# Patient Record
Sex: Female | Born: 1945
Health system: Southern US, Community
[De-identification: ages and names within clinical notes are randomized; demographics above are authoritative.]

## PROBLEM LIST (undated history)

## (undated) DIAGNOSIS — D649 Anemia, unspecified: Secondary | ICD-10-CM

## (undated) DIAGNOSIS — F0390 Unspecified dementia without behavioral disturbance: Secondary | ICD-10-CM

## (undated) DIAGNOSIS — K219 Gastro-esophageal reflux disease without esophagitis: Secondary | ICD-10-CM

## (undated) HISTORY — DX: Anemia, unspecified: D64.9

## (undated) HISTORY — PX: TOOTH EXTRACTION: SUR596

## (undated) HISTORY — DX: Gastro-esophageal reflux disease without esophagitis: K21.9

---

## 1998-08-27 ENCOUNTER — Other Ambulatory Visit: Admission: RE | Admit: 1998-08-27 | Discharge: 1998-08-27 | Payer: Self-pay | Admitting: Obstetrics and Gynecology

## 2001-08-14 ENCOUNTER — Other Ambulatory Visit: Admission: RE | Admit: 2001-08-14 | Discharge: 2001-08-14 | Payer: Self-pay | Admitting: Obstetrics and Gynecology

## 2001-08-20 ENCOUNTER — Encounter: Payer: Self-pay | Admitting: Obstetrics and Gynecology

## 2001-08-20 ENCOUNTER — Encounter: Admission: RE | Admit: 2001-08-20 | Discharge: 2001-08-20 | Payer: Self-pay | Admitting: Obstetrics and Gynecology

## 2003-01-22 ENCOUNTER — Encounter: Payer: Self-pay | Admitting: Obstetrics and Gynecology

## 2003-01-22 ENCOUNTER — Encounter: Admission: RE | Admit: 2003-01-22 | Discharge: 2003-01-22 | Payer: Self-pay | Admitting: Obstetrics and Gynecology

## 2004-10-14 ENCOUNTER — Encounter: Admission: RE | Admit: 2004-10-14 | Discharge: 2004-10-14 | Payer: Self-pay | Admitting: Obstetrics and Gynecology

## 2006-01-13 ENCOUNTER — Encounter: Admission: RE | Admit: 2006-01-13 | Discharge: 2006-01-13 | Payer: Self-pay | Admitting: Obstetrics and Gynecology

## 2008-04-29 ENCOUNTER — Ambulatory Visit: Payer: Self-pay | Admitting: Internal Medicine

## 2008-05-02 ENCOUNTER — Telehealth: Payer: Self-pay | Admitting: Internal Medicine

## 2008-05-13 ENCOUNTER — Encounter: Payer: Self-pay | Admitting: Internal Medicine

## 2008-05-13 ENCOUNTER — Ambulatory Visit: Payer: Self-pay | Admitting: Internal Medicine

## 2008-05-14 ENCOUNTER — Encounter: Payer: Self-pay | Admitting: Internal Medicine

## 2008-05-27 ENCOUNTER — Encounter: Admission: RE | Admit: 2008-05-27 | Discharge: 2008-05-27 | Payer: Self-pay | Admitting: Family Medicine

## 2008-06-06 ENCOUNTER — Encounter: Admission: RE | Admit: 2008-06-06 | Discharge: 2008-06-06 | Payer: Self-pay | Admitting: Family Medicine

## 2009-03-28 ENCOUNTER — Emergency Department (HOSPITAL_COMMUNITY): Admission: EM | Admit: 2009-03-28 | Discharge: 2009-03-28 | Payer: Self-pay | Admitting: Emergency Medicine

## 2009-04-10 ENCOUNTER — Ambulatory Visit: Payer: Self-pay | Admitting: Internal Medicine

## 2009-04-10 DIAGNOSIS — R0789 Other chest pain: Secondary | ICD-10-CM

## 2009-05-13 ENCOUNTER — Ambulatory Visit: Payer: Self-pay | Admitting: Cardiology

## 2009-05-13 ENCOUNTER — Encounter: Payer: Self-pay | Admitting: Cardiology

## 2009-05-13 DIAGNOSIS — K219 Gastro-esophageal reflux disease without esophagitis: Secondary | ICD-10-CM | POA: Insufficient documentation

## 2009-05-19 ENCOUNTER — Telehealth: Payer: Self-pay | Admitting: Internal Medicine

## 2009-05-26 ENCOUNTER — Ambulatory Visit: Payer: Self-pay

## 2009-05-26 ENCOUNTER — Encounter: Payer: Self-pay | Admitting: Cardiology

## 2010-04-11 IMAGING — CR DG CHEST 2V
2 series · 2 of 2 positions shown · non-contrast
Comparison: None.

CLINICAL DATA: Chest pain.

CHEST - 2 VIEW

[w chest pa]
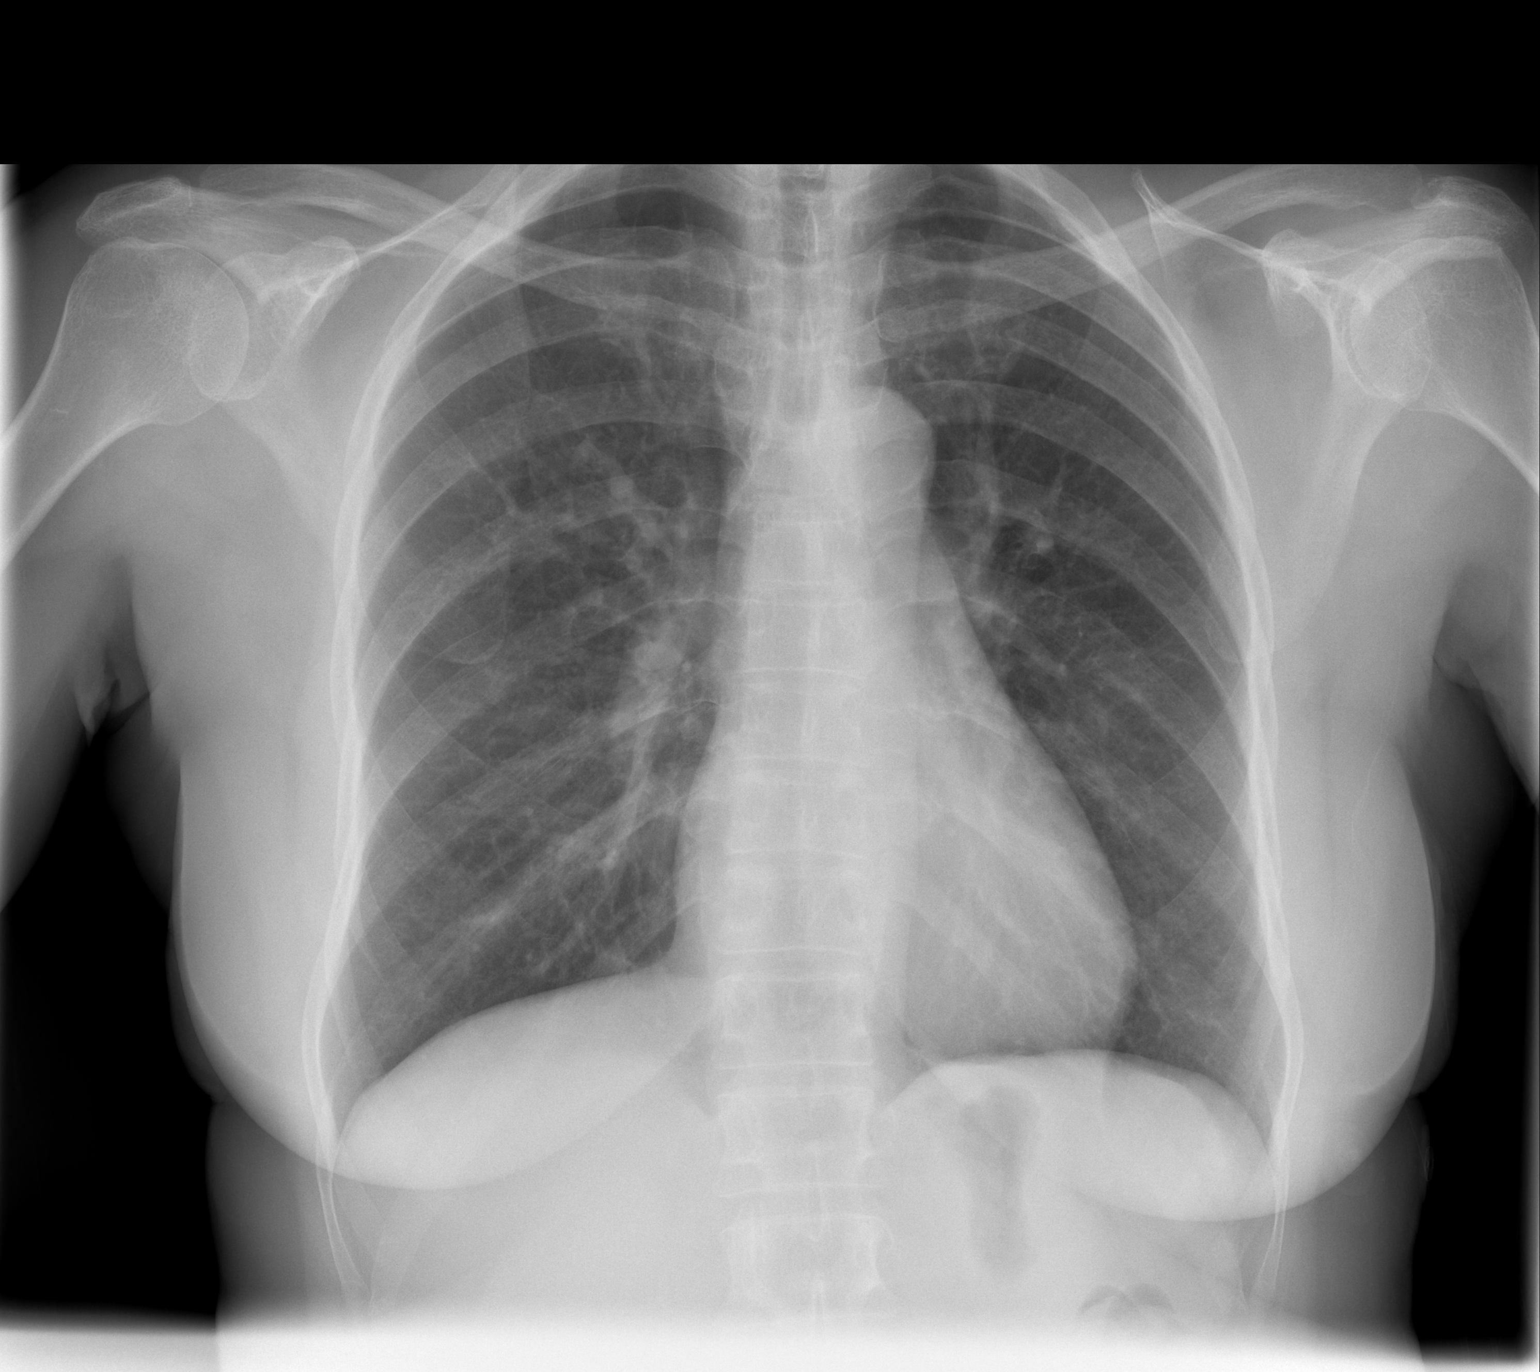

[w chest lat]
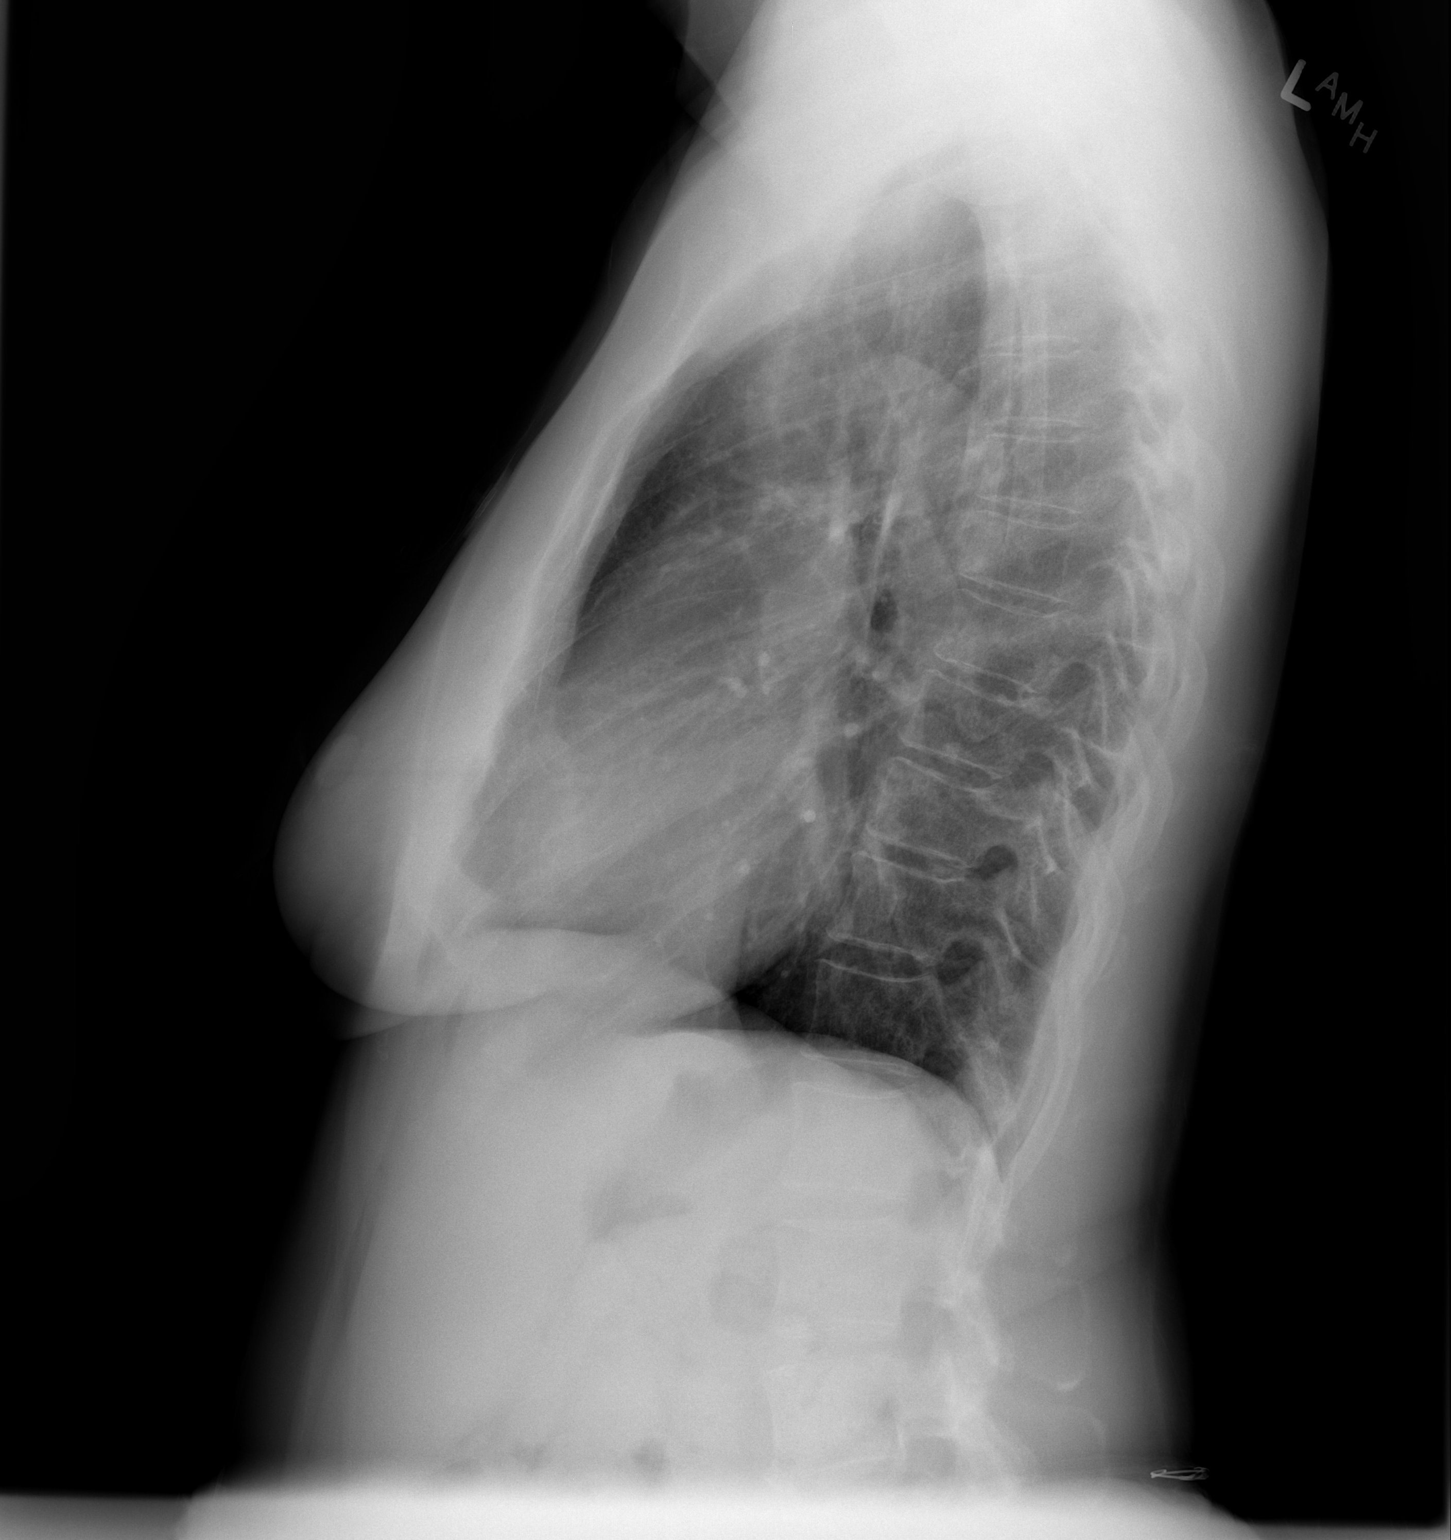

[2 of 2 positions shown; findings below may reference images not displayed]

FINDINGS: The lungs are clear without focal infiltrate, edema,
pneumothorax or pleural effusion. Interstitial markings are
diffusely coarsened with chronic features. The cardiopericardial
silhouette is within normal limits for size. Imaged bony structures
of the thorax are intact.
IMPRESSION: No acute cardiopulmonary process.

## 2010-11-16 ENCOUNTER — Encounter
Admission: RE | Admit: 2010-11-16 | Discharge: 2010-11-16 | Payer: Self-pay | Source: Home / Self Care | Attending: Internal Medicine | Admitting: Internal Medicine

## 2010-12-20 ENCOUNTER — Encounter: Payer: Self-pay | Admitting: Family Medicine

## 2011-03-08 LAB — POCT CARDIAC MARKERS
CKMB, poc: 1.6 ng/mL (ref 1.0–8.0)
Myoglobin, poc: 133 ng/mL (ref 12–200)

## 2011-03-08 LAB — POCT I-STAT, CHEM 8
HCT: 40 % (ref 36.0–46.0)
Hemoglobin: 13.6 g/dL (ref 12.0–15.0)
Potassium: 4.4 mEq/L (ref 3.5–5.1)
Sodium: 140 mEq/L (ref 135–145)
TCO2: 28 mmol/L (ref 0–100)

## 2011-03-08 LAB — D-DIMER, QUANTITATIVE: D-Dimer, Quant: 1.11 ug/mL-FEU — ABNORMAL HIGH (ref 0.00–0.48)

## 2011-07-11 ENCOUNTER — Encounter: Payer: Self-pay | Admitting: Cardiology

## 2012-09-17 ENCOUNTER — Other Ambulatory Visit: Payer: Self-pay | Admitting: Internal Medicine

## 2012-09-17 DIAGNOSIS — Z1231 Encounter for screening mammogram for malignant neoplasm of breast: Secondary | ICD-10-CM

## 2012-09-20 ENCOUNTER — Ambulatory Visit
Admission: RE | Admit: 2012-09-20 | Discharge: 2012-09-20 | Disposition: A | Discharge status: Home / Self Care | Payer: BC Managed Care – PPO | Source: Ambulatory Visit | Attending: Internal Medicine | Admitting: Internal Medicine

## 2012-09-20 DIAGNOSIS — Z1231 Encounter for screening mammogram for malignant neoplasm of breast: Secondary | ICD-10-CM

## 2013-09-04 DIAGNOSIS — E559 Vitamin D deficiency, unspecified: Secondary | ICD-10-CM | POA: Diagnosis not present

## 2013-09-04 DIAGNOSIS — L258 Unspecified contact dermatitis due to other agents: Secondary | ICD-10-CM | POA: Diagnosis not present

## 2013-09-04 DIAGNOSIS — Z124 Encounter for screening for malignant neoplasm of cervix: Secondary | ICD-10-CM | POA: Diagnosis not present

## 2013-09-04 DIAGNOSIS — Z23 Encounter for immunization: Secondary | ICD-10-CM | POA: Diagnosis not present

## 2013-09-04 DIAGNOSIS — Z01419 Encounter for gynecological examination (general) (routine) without abnormal findings: Secondary | ICD-10-CM | POA: Diagnosis not present

## 2013-09-04 DIAGNOSIS — Z0000 Encounter for general adult medical examination without abnormal findings: Secondary | ICD-10-CM | POA: Diagnosis not present

## 2014-10-20 DIAGNOSIS — Z136 Encounter for screening for cardiovascular disorders: Secondary | ICD-10-CM | POA: Diagnosis not present

## 2014-10-20 DIAGNOSIS — Z1329 Encounter for screening for other suspected endocrine disorder: Secondary | ICD-10-CM | POA: Diagnosis not present

## 2014-10-20 DIAGNOSIS — Z0000 Encounter for general adult medical examination without abnormal findings: Secondary | ICD-10-CM | POA: Diagnosis not present

## 2014-10-20 DIAGNOSIS — Z131 Encounter for screening for diabetes mellitus: Secondary | ICD-10-CM | POA: Diagnosis not present

## 2014-10-20 DIAGNOSIS — Z1239 Encounter for other screening for malignant neoplasm of breast: Secondary | ICD-10-CM | POA: Diagnosis not present

## 2014-10-21 ENCOUNTER — Other Ambulatory Visit: Payer: Self-pay | Admitting: Family Medicine

## 2014-10-21 DIAGNOSIS — E2839 Other primary ovarian failure: Secondary | ICD-10-CM

## 2014-10-21 DIAGNOSIS — Z1231 Encounter for screening mammogram for malignant neoplasm of breast: Secondary | ICD-10-CM

## 2014-12-04 ENCOUNTER — Ambulatory Visit
Admission: RE | Admit: 2014-12-04 | Discharge: 2014-12-04 | Disposition: A | Discharge status: Home / Self Care | Payer: Commercial Managed Care - HMO | Source: Ambulatory Visit | Attending: Family Medicine | Admitting: Family Medicine

## 2014-12-04 DIAGNOSIS — E2839 Other primary ovarian failure: Secondary | ICD-10-CM

## 2014-12-04 DIAGNOSIS — Z1231 Encounter for screening mammogram for malignant neoplasm of breast: Secondary | ICD-10-CM

## 2015-04-01 ENCOUNTER — Encounter: Payer: Self-pay | Admitting: Internal Medicine

## 2018-04-12 ENCOUNTER — Encounter: Payer: Self-pay | Admitting: Gastroenterology

## 2018-12-25 ENCOUNTER — Other Ambulatory Visit: Payer: Self-pay | Admitting: Family Medicine

## 2018-12-25 DIAGNOSIS — M8580 Other specified disorders of bone density and structure, unspecified site: Secondary | ICD-10-CM

## 2018-12-25 DIAGNOSIS — Z1231 Encounter for screening mammogram for malignant neoplasm of breast: Secondary | ICD-10-CM

## 2019-02-21 ENCOUNTER — Other Ambulatory Visit: Payer: Commercial Managed Care - HMO

## 2019-02-21 ENCOUNTER — Ambulatory Visit: Payer: Commercial Managed Care - HMO

## 2019-05-02 ENCOUNTER — Other Ambulatory Visit: Payer: Self-pay

## 2019-05-02 ENCOUNTER — Other Ambulatory Visit: Payer: Self-pay | Admitting: Family Medicine

## 2019-05-02 ENCOUNTER — Ambulatory Visit
Admission: RE | Admit: 2019-05-02 | Discharge: 2019-05-02 | Disposition: A | Discharge status: Home / Self Care | Payer: Commercial Managed Care - HMO | Source: Ambulatory Visit | Attending: Family Medicine | Admitting: Family Medicine

## 2019-05-02 ENCOUNTER — Ambulatory Visit
Admission: RE | Admit: 2019-05-02 | Discharge: 2019-05-02 | Disposition: A | Discharge status: Home / Self Care | Payer: Medicare Other | Source: Ambulatory Visit | Attending: Family Medicine | Admitting: Family Medicine

## 2019-05-02 DIAGNOSIS — M8580 Other specified disorders of bone density and structure, unspecified site: Secondary | ICD-10-CM

## 2019-05-02 DIAGNOSIS — Z1231 Encounter for screening mammogram for malignant neoplasm of breast: Secondary | ICD-10-CM

## 2020-01-11 ENCOUNTER — Ambulatory Visit: Payer: Self-pay | Attending: Internal Medicine

## 2020-01-11 DIAGNOSIS — Z23 Encounter for immunization: Secondary | ICD-10-CM | POA: Insufficient documentation

## 2020-01-11 NOTE — Progress Notes (Signed)
   Covid-19 Vaccination Clinic  Name:  Sharon Elliott    MRN: [id number] DOB: 1946/04/03  01/11/2020  [name] was observed post Covid-19 immunization for 15 minutes without incidence. She was provided with Vaccine Information Sheet and instruction to access the V-Safe system.   Ms. Cheese was instructed to call 911 with any severe reactions post vaccine: Marland Kitchen Difficulty breathing  . Swelling of your face and throat  . A fast heartbeat  . A bad rash all over your body  . Dizziness and weakness    Immunizations Administered    Name Date Dose VIS Date Route   Pfizer COVID-19 Vaccine 01/11/2020  2:28 PM 0.3 mL 11/08/2019 Intramuscular   Manufacturer: [employer], [employer]   Lot: [id number]   NDC: [address]-8

## 2020-02-03 ENCOUNTER — Ambulatory Visit: Payer: Medicare PPO | Attending: Internal Medicine

## 2020-02-03 DIAGNOSIS — Z23 Encounter for immunization: Secondary | ICD-10-CM | POA: Insufficient documentation

## 2020-02-03 NOTE — Progress Notes (Signed)
   Covid-19 Vaccination Clinic  Name:  Sharon Elliott    MRN: [id number] DOB: [birth year]  02/03/2020  Sharon Elliott was observed post Covid-19 immunization for 15 minutes without incident. She was provided with Vaccine Information Sheet and instruction to access the V-Safe system.   Sharon Elliott was instructed to call 911 with any severe reactions post vaccine: Marland Kitchen Difficulty breathing  . Swelling of face and throat  . A fast heartbeat  . A bad rash all over body  . Dizziness and weakness   Immunizations Administered    Name Date Dose VIS Date Route   Pfizer COVID-19 Vaccine 02/03/2020 11:48 AM 0.3 mL 11/08/2019 Intramuscular   Manufacturer: [employer], [employer]   Lot: [id number]   NDC: [address]-8

## 2020-03-30 ENCOUNTER — Other Ambulatory Visit: Payer: Self-pay | Admitting: Family Medicine

## 2020-03-30 DIAGNOSIS — Z1231 Encounter for screening mammogram for malignant neoplasm of breast: Secondary | ICD-10-CM

## 2020-05-04 ENCOUNTER — Other Ambulatory Visit: Payer: Self-pay

## 2020-05-04 ENCOUNTER — Ambulatory Visit
Admission: RE | Admit: 2020-05-04 | Discharge: 2020-05-04 | Disposition: A | Discharge status: Home / Self Care | Payer: Medicare PPO | Source: Ambulatory Visit | Attending: Family Medicine | Admitting: Family Medicine

## 2020-05-04 DIAGNOSIS — Z1231 Encounter for screening mammogram for malignant neoplasm of breast: Secondary | ICD-10-CM

## 2020-05-15 IMAGING — MG DIGITAL SCREENING BILATERAL MAMMOGRAM WITH TOMO AND CAD
8 series · 9 of 24 positions shown · non-contrast
Comparison: Previous exam(s).

CLINICAL DATA: Screening.

EXAM:
DIGITAL SCREENING BILATERAL MAMMOGRAM WITH TOMO AND CAD

[L CC synth-2D]
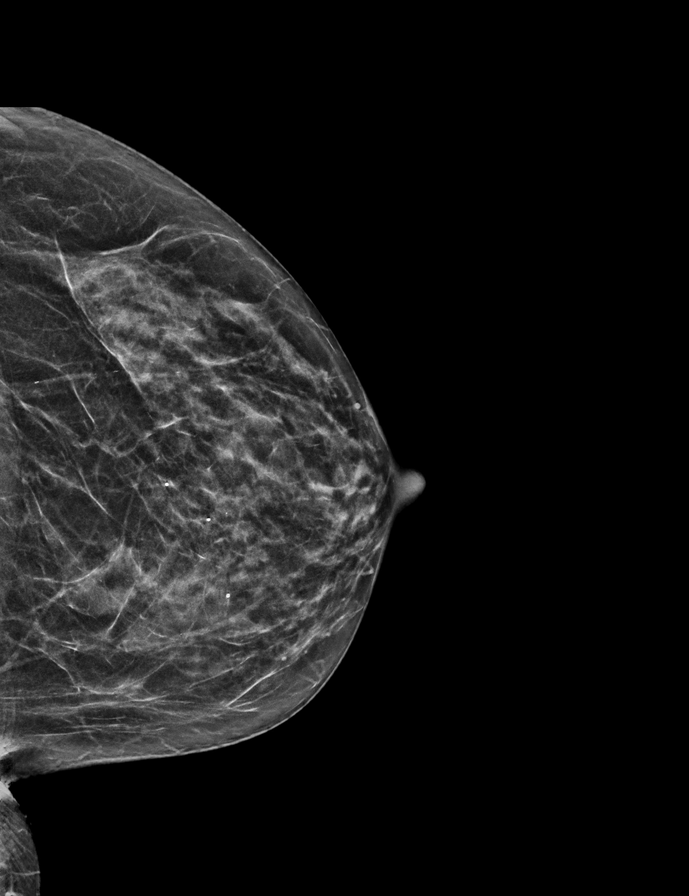

[L MLO synth-2D]
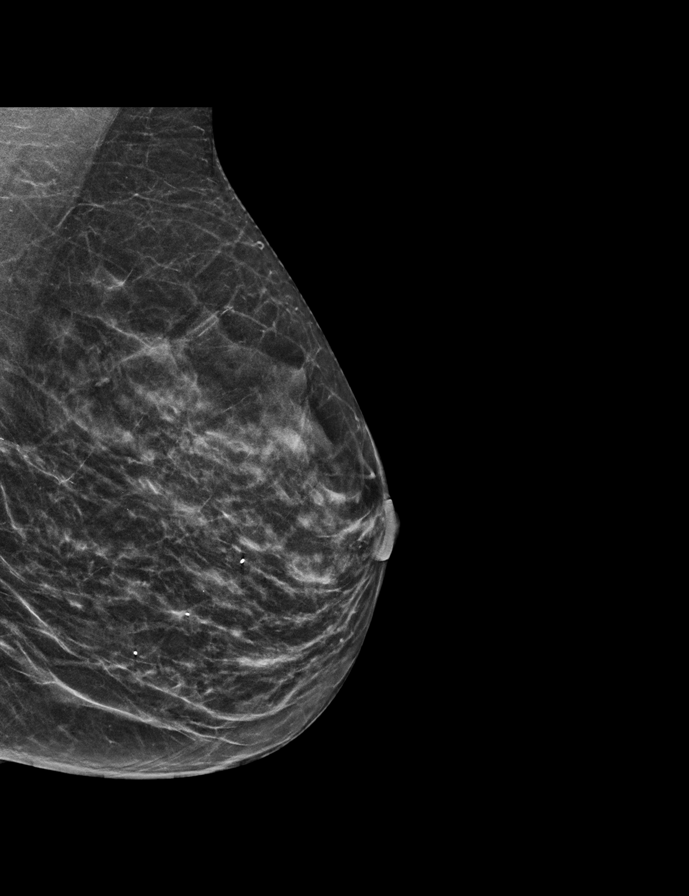

[R CC synth-2D]
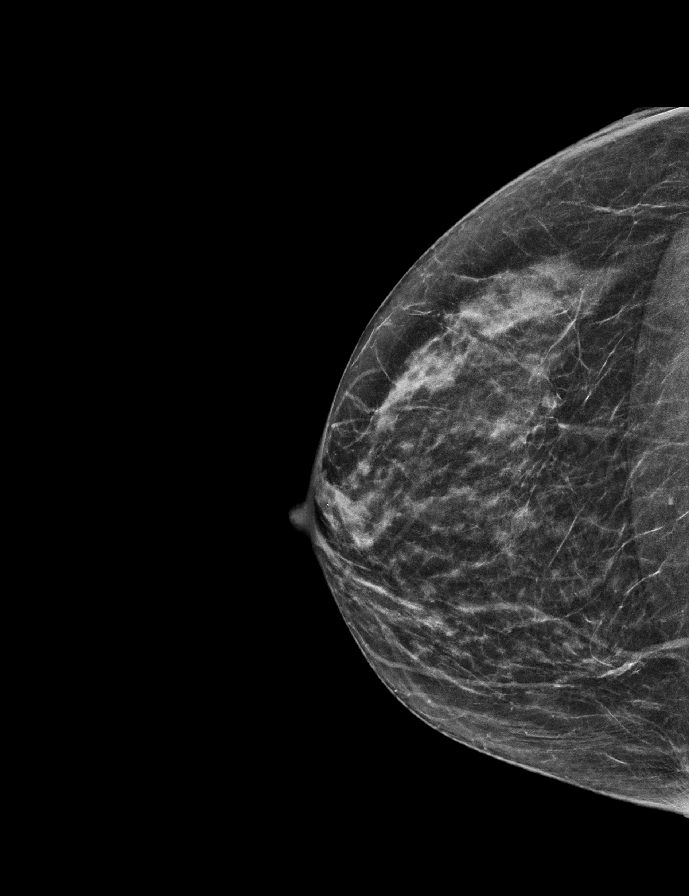

[R MLO synth-2D]
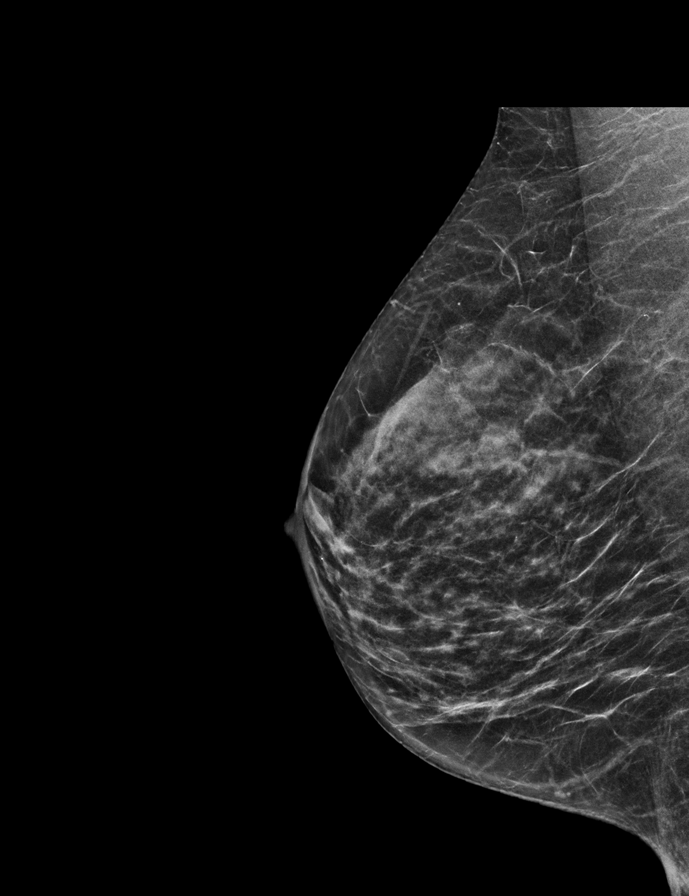

[R CC tomo · 2 of 49 frames shown]
[frame 16/49]
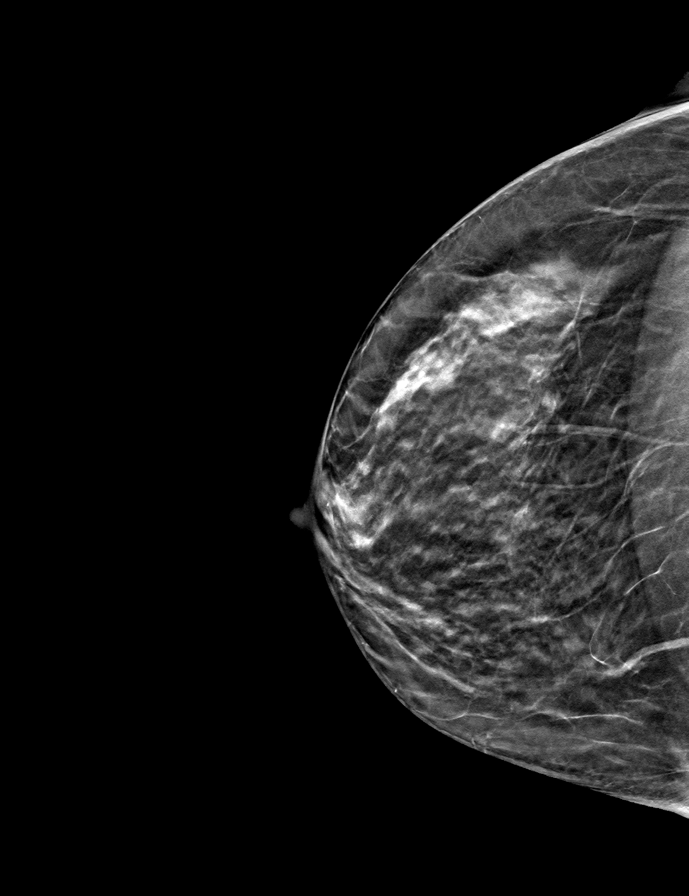
[frame 25/49]
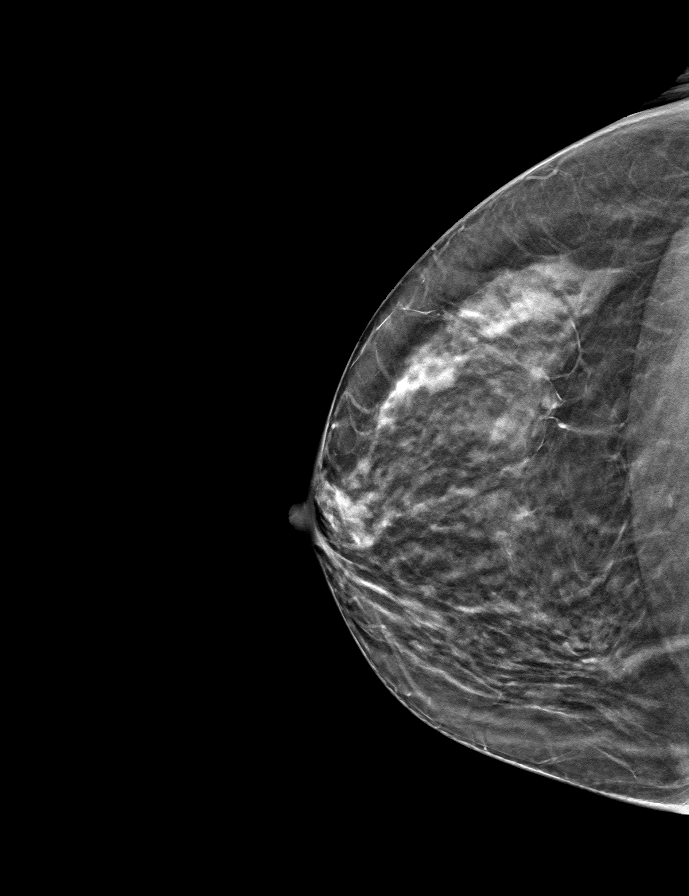

[R MLO tomo · tomo slice 24/47.0]
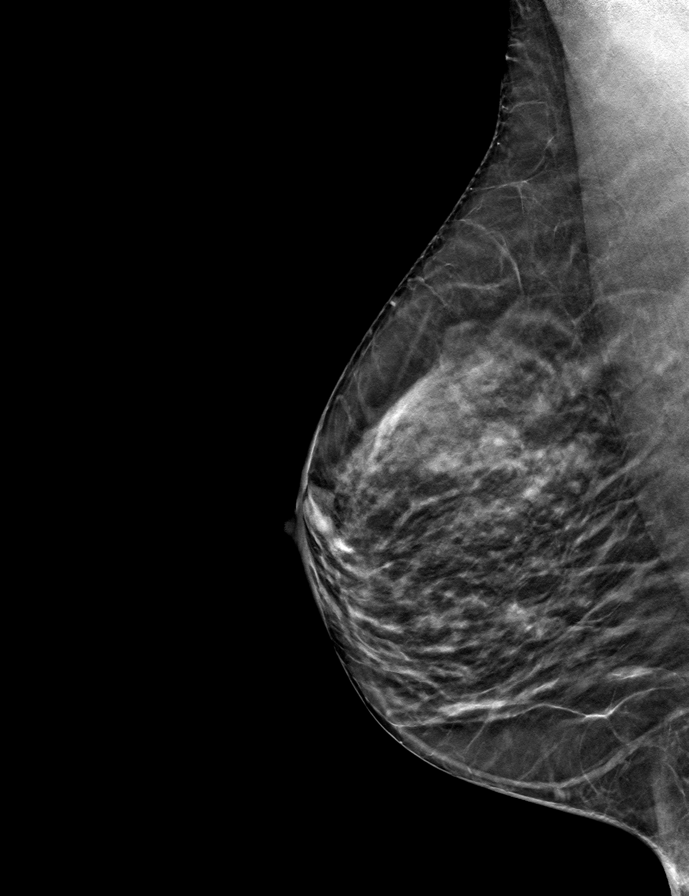

[L MLO tomo · tomo slice 24/47.0]
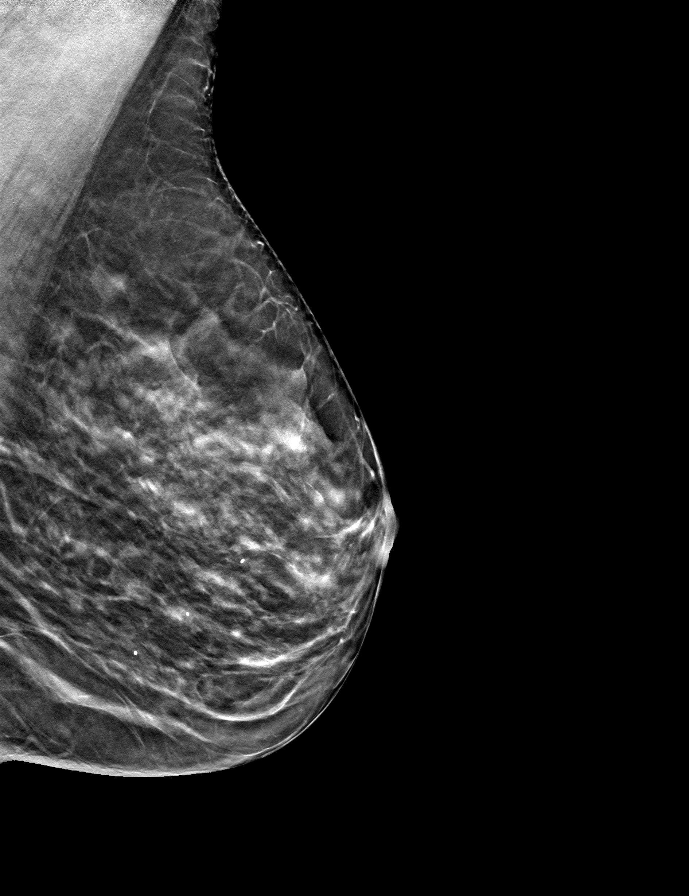

[L CC tomo · tomo slice 25/48.0]
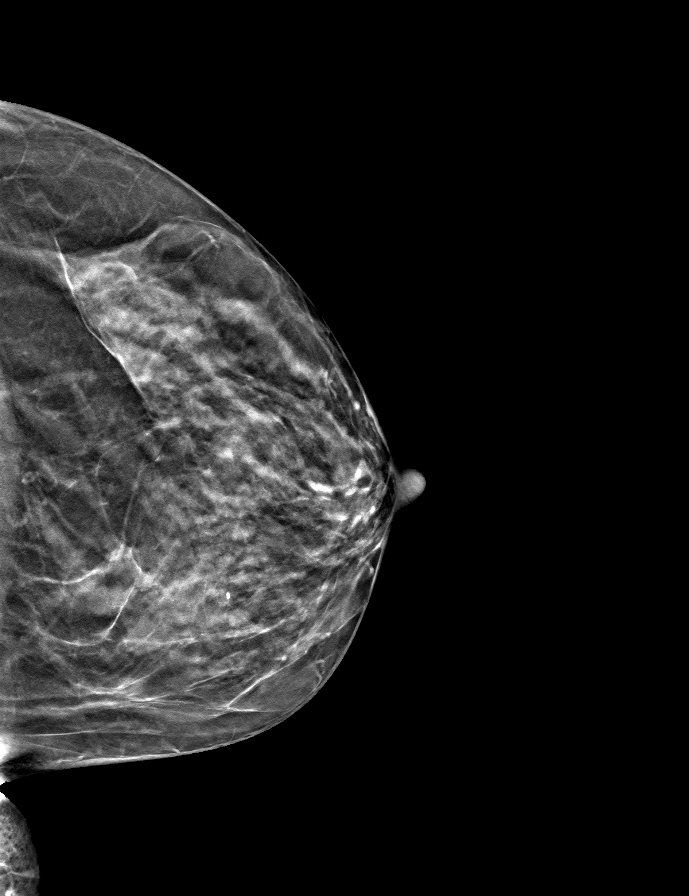

[9 of 24 positions shown; findings below may reference images not displayed]

ACR Breast Density Category c: The breast tissue is heterogeneously
dense, which may obscure small masses.
FINDINGS: There are no findings suspicious for malignancy. Images were
processed with CAD.
IMPRESSION: No mammographic evidence of malignancy. A result letter of this
screening mammogram will be mailed directly to the patient.

RECOMMENDATION:
Screening mammogram in one year. (Code:[id number])

BI-RADS CATEGORY  1: Negative.

## 2020-06-26 DIAGNOSIS — Z820 Family history of epilepsy and other diseases of the nervous system: Secondary | ICD-10-CM | POA: Diagnosis not present

## 2020-06-26 DIAGNOSIS — Z85828 Personal history of other malignant neoplasm of skin: Secondary | ICD-10-CM | POA: Diagnosis not present

## 2020-06-26 DIAGNOSIS — Z809 Family history of malignant neoplasm, unspecified: Secondary | ICD-10-CM | POA: Diagnosis not present

## 2020-06-26 DIAGNOSIS — R030 Elevated blood-pressure reading, without diagnosis of hypertension: Secondary | ICD-10-CM | POA: Diagnosis not present

## 2020-08-21 ENCOUNTER — Other Ambulatory Visit: Payer: Medicare PPO

## 2020-08-21 DIAGNOSIS — Z20822 Contact with and (suspected) exposure to covid-19: Secondary | ICD-10-CM | POA: Diagnosis not present

## 2020-08-22 LAB — NOVEL CORONAVIRUS, NAA: SARS-CoV-2, NAA: NOT DETECTED

## 2020-08-22 LAB — SARS-COV-2, NAA 2 DAY TAT

## 2020-08-26 DIAGNOSIS — Z23 Encounter for immunization: Secondary | ICD-10-CM | POA: Diagnosis not present

## 2020-08-26 DIAGNOSIS — R413 Other amnesia: Secondary | ICD-10-CM | POA: Diagnosis not present

## 2020-09-26 ENCOUNTER — Ambulatory Visit: Payer: Medicare PPO | Attending: Internal Medicine

## 2020-09-26 DIAGNOSIS — Z23 Encounter for immunization: Secondary | ICD-10-CM

## 2020-09-26 NOTE — Progress Notes (Signed)
   Covid-19 Vaccination Clinic  Name:  CHAELA BRANSCUM    MRN: [id number] DOB: [birth year]  09/26/2020  Ms. Howeth was observed post Covid-19 immunization for 15 minutes without incident. She was provided with Vaccine Information Sheet and instruction to access the V-Safe system.   Ms. Montville was instructed to call 911 with any severe reactions post vaccine: Marland Kitchen Difficulty breathing  . Swelling of face and throat  . A fast heartbeat  . A bad rash all over body  . Dizziness and weakness

## 2021-04-21 DIAGNOSIS — Z131 Encounter for screening for diabetes mellitus: Secondary | ICD-10-CM | POA: Diagnosis not present

## 2021-04-21 DIAGNOSIS — Z0000 Encounter for general adult medical examination without abnormal findings: Secondary | ICD-10-CM | POA: Diagnosis not present

## 2021-04-21 DIAGNOSIS — R413 Other amnesia: Secondary | ICD-10-CM | POA: Diagnosis not present

## 2021-04-21 DIAGNOSIS — M8588 Other specified disorders of bone density and structure, other site: Secondary | ICD-10-CM | POA: Diagnosis not present

## 2021-04-21 DIAGNOSIS — E785 Hyperlipidemia, unspecified: Secondary | ICD-10-CM | POA: Diagnosis not present

## 2021-04-21 DIAGNOSIS — M8580 Other specified disorders of bone density and structure, unspecified site: Secondary | ICD-10-CM | POA: Diagnosis not present

## 2021-04-21 DIAGNOSIS — Z23 Encounter for immunization: Secondary | ICD-10-CM | POA: Diagnosis not present

## 2021-04-21 DIAGNOSIS — Z1211 Encounter for screening for malignant neoplasm of colon: Secondary | ICD-10-CM | POA: Diagnosis not present

## 2021-04-21 DIAGNOSIS — Z1389 Encounter for screening for other disorder: Secondary | ICD-10-CM | POA: Diagnosis not present

## 2021-05-14 ENCOUNTER — Other Ambulatory Visit: Payer: Self-pay

## 2021-05-14 ENCOUNTER — Other Ambulatory Visit (HOSPITAL_BASED_OUTPATIENT_CLINIC_OR_DEPARTMENT_OTHER): Payer: Self-pay

## 2021-05-14 ENCOUNTER — Ambulatory Visit: Payer: Medicare PPO | Attending: Internal Medicine

## 2021-05-14 DIAGNOSIS — Z23 Encounter for immunization: Secondary | ICD-10-CM

## 2021-05-14 MED ORDER — PFIZER-BIONT COVID-19 VAC-TRIS 30 MCG/0.3ML IM SUSP
INTRAMUSCULAR | 0 refills | Status: DC
  Filled 2021-05-14: qty 0.3, 1d supply, fill #0

## 2021-05-14 NOTE — Progress Notes (Signed)
   Covid-19 Vaccination Clinic  Name:  Sharon Elliott    MRN: [id number] DOB: 1946/07/20  05/14/2021  Sharon Elliott was observed post Covid-19 immunization for 15 minutes without incident. She was provided with Vaccine Information Sheet and instruction to access the V-Safe system.   Sharon Elliott was instructed to call 911 with any severe reactions post vaccine: Difficulty breathing  Swelling of face and throat  A fast heartbeat  A bad rash all over body  Dizziness and weakness   Immunizations Administered     Name Date Dose VIS Date Route   PFIZER Comrnaty(Gray TOP) Covid-19 Vaccine 05/14/2021 10:40 AM 0.3 mL 11/05/2020 Intramuscular   Manufacturer: [employer], [employer]   Lot: [id number]   NDC: [phone]

## 2021-10-04 ENCOUNTER — Ambulatory Visit: Payer: Medicare PPO | Attending: Internal Medicine

## 2021-10-04 ENCOUNTER — Other Ambulatory Visit: Payer: Self-pay

## 2021-10-04 ENCOUNTER — Other Ambulatory Visit (HOSPITAL_BASED_OUTPATIENT_CLINIC_OR_DEPARTMENT_OTHER): Payer: Self-pay

## 2021-10-04 DIAGNOSIS — Z23 Encounter for immunization: Secondary | ICD-10-CM

## 2021-10-04 MED ORDER — PFIZER COVID-19 VAC BIVALENT 30 MCG/0.3ML IM SUSP
INTRAMUSCULAR | 0 refills | Status: DC
  Filled 2021-10-04: qty 0.3, 1d supply, fill #0

## 2021-10-04 NOTE — Progress Notes (Signed)
   Covid-19 Vaccination Clinic  Name:  Sharon Elliott    MRN: [id number] DOB: 08/01/1946  10/04/2021  Sharon Elliott was observed post Covid-19 immunization for 15 minutes without incident. She was provided with Vaccine Information Sheet and instruction to access the V-Safe system.   Sharon Elliott was instructed to call 911 with any severe reactions post vaccine: Difficulty breathing  Swelling of face and throat  A fast heartbeat  A bad rash all over body  Dizziness and weakness   Immunizations Administered     Name Date Dose VIS Date Route   Pfizer Covid-19 Vaccine Bivalent Booster 10/04/2021  3:45 PM 0.3 mL 07/28/2021 Intramuscular   Manufacturer: [employer], [employer]   Lot: [id number]   NDC: [phone]

## 2021-11-30 DIAGNOSIS — R110 Nausea: Secondary | ICD-10-CM | POA: Diagnosis not present

## 2021-11-30 DIAGNOSIS — R030 Elevated blood-pressure reading, without diagnosis of hypertension: Secondary | ICD-10-CM | POA: Diagnosis not present

## 2021-11-30 DIAGNOSIS — R059 Cough, unspecified: Secondary | ICD-10-CM | POA: Diagnosis not present

## 2021-11-30 DIAGNOSIS — Z20822 Contact with and (suspected) exposure to covid-19: Secondary | ICD-10-CM | POA: Diagnosis not present

## 2021-11-30 DIAGNOSIS — U071 COVID-19: Secondary | ICD-10-CM | POA: Diagnosis not present

## 2022-05-04 DIAGNOSIS — R413 Other amnesia: Secondary | ICD-10-CM | POA: Diagnosis not present

## 2022-05-04 DIAGNOSIS — Z0000 Encounter for general adult medical examination without abnormal findings: Secondary | ICD-10-CM | POA: Diagnosis not present

## 2022-05-04 DIAGNOSIS — M8588 Other specified disorders of bone density and structure, other site: Secondary | ICD-10-CM | POA: Diagnosis not present

## 2022-05-04 DIAGNOSIS — E785 Hyperlipidemia, unspecified: Secondary | ICD-10-CM | POA: Diagnosis not present

## 2022-05-09 ENCOUNTER — Other Ambulatory Visit: Payer: Self-pay | Admitting: Family Medicine

## 2022-05-09 DIAGNOSIS — M8580 Other specified disorders of bone density and structure, unspecified site: Secondary | ICD-10-CM

## 2022-07-01 DIAGNOSIS — E785 Hyperlipidemia, unspecified: Secondary | ICD-10-CM | POA: Diagnosis not present

## 2022-08-05 DIAGNOSIS — E785 Hyperlipidemia, unspecified: Secondary | ICD-10-CM | POA: Diagnosis not present

## 2022-09-19 ENCOUNTER — Other Ambulatory Visit (HOSPITAL_BASED_OUTPATIENT_CLINIC_OR_DEPARTMENT_OTHER): Payer: Self-pay

## 2022-09-19 MED ORDER — COMIRNATY 30 MCG/0.3ML IM SUSY
PREFILLED_SYRINGE | INTRAMUSCULAR | 0 refills | Status: DC
  Filled 2022-09-19: qty 0.3, 1d supply, fill #0

## 2022-11-11 DIAGNOSIS — M8588 Other specified disorders of bone density and structure, other site: Secondary | ICD-10-CM | POA: Diagnosis not present

## 2022-11-11 DIAGNOSIS — E785 Hyperlipidemia, unspecified: Secondary | ICD-10-CM | POA: Diagnosis not present

## 2022-11-11 DIAGNOSIS — R413 Other amnesia: Secondary | ICD-10-CM | POA: Diagnosis not present

## 2022-11-16 ENCOUNTER — Ambulatory Visit: Payer: Medicare PPO | Admitting: Neurology

## 2022-11-16 ENCOUNTER — Encounter: Payer: Self-pay | Admitting: Neurology

## 2022-11-16 VITALS — BP 135/73 | HR 88 | Ht 66.0 in | Wt 126.0 lb

## 2022-11-16 DIAGNOSIS — G3184 Mild cognitive impairment, so stated: Secondary | ICD-10-CM

## 2022-11-16 NOTE — Progress Notes (Unsigned)
[facility]  PATIENT: Sharon Elliott DOB: 1946/03/28  REQUESTING CLINICIAN: Maurice Small, MD HISTORY FROM: Patient and daughter  REASON FOR VISIT: Memory loss    HISTORICAL  CHIEF COMPLAINT:  Chief Complaint  Patient presents with   New Patient (Initial Visit)    Rm 12. Accompanied by daughter. NP/Paper Proficient/[facility]/Eli Lewie Chamber MD [phone] loss/sched with pt in office/MS.    HISTORY OF PRESENT ILLNESS:  This is a 76 year old woman with history of hyperlipidemia who is presenting with memory decline.  Patient feels like her memory is not what it used to be.  She also reports since COVID pandemic she has not been as active as before. She feels stuck. 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  During the pandemic she was stuck in the house and since then they have noted that her memory has changed.  She has difficulty sometimes recalling recent conversation, she also have word finding difficulty  Daughter reports that she has to remind her multiple times, for instance for today's appointment she did remind her again.  At home, daughter is doing almost everything with cleaning, cooking, and shopping, and mowing the lawn.  They report that their father/husband is also sick and has decreased mobility  Patient is able to care for herself, they do not report any difficulty with using items around the house.  Daughter has taken over some of her bills.   TBI:   No past history of TBI Stroke:   no past history of stroke Seizures:   no past history of seizures Sleep:   no history of sleep apnea.  Mood:  patient denies anxiety and depression Family history of Dementia: Mother in her 7s   Functional status: independent in all ADLs and IADLs Patient lives with husband and daughter. Cooking: daughter  Cleaning: daughter  Shopping: daughter  Bathing: patient, no help needed  Toileting: patient, no  help needed Driving: yes, no accident  Bills: Husband, daughter has also been helping paying patient personal bills   Ever left the stove on by accident?: Denies  Forget how to use items around the house?: No  Getting lost going to familiar places?: Denies  Forgetting loved ones names?: Denies  Word finding difficulty? Yes Sleep: Good    OTHER MEDICAL CONDITIONS: Hyperlipidemia    REVIEW OF SYSTEMS: Full 14 system review of systems performed and negative with exception of: As noted in the HPI   ALLERGIES: No Known Allergies  HOME MEDICATIONS: Outpatient Medications Prior to Visit  Medication Sig Dispense Refill   Cholecalciferol (VITAMIN D-3 PO) Take 1 tablet by mouth daily.     Cyanocobalamin (VITAMIN B-12 PO) Take 1 capsule by mouth daily.     NON FORMULARY Take 3 tablets by mouth daily. cocoa via supplement for memory     Pyridoxine HCl (VITAMIN B-6 PO) Take by mouth.     COVID-19 mRNA bivalent vaccine, Pfizer, (PFIZER COVID-19 VAC BIVALENT) injection Inject into the muscle. 0.3 mL 0   COVID-19 mRNA Vac-TriS, Pfizer, (PFIZER-BIONT COVID-19 VAC-TRIS) SUSP injection Inject into the muscle. 0.3 mL 0   COVID-19 mRNA vaccine 2023-2024 (COMIRNATY) syringe Inject into the muscle. 0.3 mL 0   NON FORMULARY Calcium 200 IU: daily      NON FORMULARY COD Liver Oil: daily      Soy Isoflavones 50 MG TABS Take by mouth daily.       vitamin E 600 UNIT capsule Take 600 Units by  mouth daily.       No facility-administered medications prior to visit.    PAST MEDICAL HISTORY: Past Medical History:  Diagnosis Date   Anemia    hx   GERD (gastroesophageal reflux disease)     PAST SURGICAL HISTORY: History reviewed. No pertinent surgical history.  FAMILY HISTORY: Family History  Problem Relation Age of Onset   Breast cancer Other        1st degree relative <50   Stroke Other        F 1st degree relative <60   Hypertension Other    Diabetes Other        1st degree relative     SOCIAL HISTORY: Social History   Socioeconomic History   Marital status: Married    Spouse name: Not on file   Number of children: Not on file   Years of education: Not on file   Highest education level: Not on file  Occupational History   Not on file  Tobacco Use   Smoking status: Never   Smokeless tobacco: Not on file  Substance and Sexual Activity   Alcohol use: No   Drug use: Not on file   Sexual activity: Not on file  Other Topics Concern   Not on file  Social History Narrative   Not on file   Social Determinants of Health   Financial Resource Strain: Not on file  Food Insecurity: Not on file  Transportation Needs: Not on file  Physical Activity: Not on file  Stress: Not on file  Social Connections: Not on file  Intimate Partner Violence: Not on file    PHYSICAL EXAM  GENERAL EXAM/CONSTITUTIONAL: Vitals:  Vitals:   11/16/22 1315  BP: 135/73  Pulse: 88  Weight: 126 lb (57.2 kg)  Height: 5\' 6"  (1.676 m)   Body mass index is 20.34 kg/m. Wt Readings from Last 3 Encounters:  11/16/22 126 lb (57.2 kg)  05/13/09 158 lb 8 oz (71.9 kg)   Patient is in no distress; well developed, nourished and groomed; neck is supple   EYES: Visual fields full to confrontation, Extraocular movements intacts,   MUSCULOSKELETAL: Gait, strength, tone, movements noted in Neurologic exam below  NEUROLOGIC: MENTAL STATUS:      No data to display            11/16/2022    1:25 PM  Montreal Cognitive Assessment   Visuospatial/ Executive (0/5) 5  Naming (0/3) 3  Attention: Read list of digits (0/2) 2  Attention: Read list of letters (0/1) 1  Attention: Serial 7 subtraction starting at 100 (0/3) 3  Language: Repeat phrase (0/2) 2  Language : Fluency (0/1) 1  Abstraction (0/2) 1  Delayed Recall (0/5) 0  Orientation (0/6) 2  Total 20  Adjusted Score (based on education) 20    CRANIAL NERVE:  2nd, 3rd, 4th, 6th- visual fields full to confrontation,  extraocular muscles intact, no nystagmus 5th - facial sensation symmetric 7th - facial strength symmetric 8th - hearing intact 9th - palate elevates symmetrically, uvula midline 11th - shoulder shrug symmetric 12th - tongue protrusion midline  MOTOR:  normal bulk and tone, full strength in the BUE, BLE  SENSORY:  normal and symmetric to light touch  COORDINATION:  finger-nose-finger, fine finger movements normal  GAIT/STATION:  normal   DIAGNOSTIC DATA (LABS, IMAGING, TESTING) - I reviewed patient records, labs, notes, testing and imaging myself where available.  Lab Results  Component Value Date   HGB 13.6 03/28/2009  HCT 40.0 03/28/2009      Component Value Date/Time   NA 140 03/28/2009 1526   K 4.4 03/28/2009 1526   CL 106 03/28/2009 1526   GLUCOSE 94 03/28/2009 1526   BUN 22 03/28/2009 1526   CREATININE 1.2 03/28/2009 1526   No results found for: "CHOL", "HDL", "LDLCALC", "LDLDIRECT", "TRIG", "CHOLHDL" No results found for: "HGBA1C" No results found for: "VITAMINB12" Lab Results  Component Value Date   TSH 1.010 11/16/2022    Vitamin B12  > 1550   04/23/2021    ASSESSMENT AND PLAN  76 y.o. year old female with history of hyperlipidemia who is presenting with memory decline described as being forgetful, needs reminders and word finding difficulty.  Today on exam she scored 20 out of 30 on the MoCA indicative of impairment.  She is still independent, able to take care of herself.  Patient likely has Mild Cognitive Impairment.  I will refer her for a formal neuropsychological testing to get a baseline and to help with the diagnosis also.  I will also obtain the ATN profile to look for biomarkers for Alzheimer's disease.  I will contact patient to go over the results.  I will see him in 1 year for follow-up or sooner if worse.  They voiced understanding    1. Mild cognitive impairment      Patient Instructions  There are well-accepted and sensible ways to  reduce risk for Alzheimers disease and other degenerative brain disorders .  Exercise Daily Walk A daily 20 minute walk should be part of your routine. Disease related apathy can be a significant roadblock to exercise and the only way to overcome this is to make it a daily routine and perhaps have a reward at the end (something your loved one loves to eat or drink perhaps) or a personal trainer coming to the home can also be very useful. Most importantly, the patient is much more likely to exercise if the caregiver / spouse does it with him/her. In general a structured, repetitive schedule is best.  General Health: Any diseases which effect your body will effect your brain such as a pneumonia, urinary infection, blood clot, heart attack or stroke. Keep contact with your primary care doctor for regular follow ups.  Sleep. A good nights sleep is healthy for the brain. Seven hours is recommended. If you have insomnia or poor sleep habits we can give you some instructions. If you have sleep apnea wear your mask.  Diet: Eating a heart healthy diet is also a good idea; fish and poultry instead of red meat, nuts (mostly non-peanuts), vegetables, fruits, olive oil or canola oil (instead of butter), minimal salt (use other spices to flavor foods), whole grain rice, bread, cereal and pasta and wine in moderation.Research is now showing that the MIND diet, which is a combination of The Mediterranean diet and the DASH diet, is beneficial for cognitive processing and longevity. Information about this diet can be found in The MIND Diet, a book by Alonna Minium, MS, RDN, and online at [employer].es  Finances, Power of [address] and Advance Directives: You should consider putting legal safeguards in place with regard to financial and medical decision making. While the spouse always has power of attorney for medical and financial issues in the absence of any form, you should consider what you  want in case the spouse / caregiver is no longer around or capable of making decisions.   Orders Placed This Encounter  Procedures   ATN  PROFILE   TSH   Ambulatory referral to Neuropsychology    No orders of the defined types were placed in this encounter.   Return in about 1 year (around 11/17/2023).  I have spent a total of 70 minutes dedicated to this patient today, preparing to see patient, performing a medically appropriate examination and evaluation, ordering tests and/or medications and procedures, and counseling and educating the patient/family/caregiver; independently interpreting result and communicating results to the family/patient/caregiver; and documenting clinical information in the electronic medical record.   Windell NorfolkAmadou Taaliyah Delpriore, MD 11/17/2022, 8:24 AM  [facility] [address], [address] [place], [place] [address] [phone]

## 2022-11-16 NOTE — Patient Instructions (Signed)
There are well-accepted and sensible ways to reduce risk for Alzheimers disease and other degenerative brain disorders .  Exercise Daily Walk A daily 20 minute walk should be part of your routine. Disease related apathy can be a significant roadblock to exercise and the only way to overcome this is to make it a daily routine and perhaps have a reward at the end (something your loved one loves to eat or drink perhaps) or a personal trainer coming to the home can also be very useful. Most importantly, the patient is much more likely to exercise if the caregiver / spouse does it with him/her. In general a structured, repetitive schedule is best.  General Health: Any diseases which effect your body will effect your brain such as a pneumonia, urinary infection, blood clot, heart attack or stroke. Keep contact with your primary care doctor for regular follow ups.  Sleep. A good nights sleep is healthy for the brain. Seven hours is recommended. If you have insomnia or poor sleep habits we can give you some instructions. If you have sleep apnea wear your mask.  Diet: Eating a heart healthy diet is also a good idea; fish and poultry instead of red meat, nuts (mostly non-peanuts), vegetables, fruits, olive oil or canola oil (instead of butter), minimal salt (use other spices to flavor foods), whole grain rice, bread, cereal and pasta and wine in moderation.Research is now showing that the MIND diet, which is a combination of The Mediterranean diet and the DASH diet, is beneficial for cognitive processing and longevity. Information about this diet can be found in The MIND Diet, a book by Alonna Minium, MS, RDN, and online at [employer]  Finances, Power of [address] and Advance Directives: You should consider putting legal safeguards in place with regard to financial and medical decision making. While the spouse always has power of attorney for medical and financial issues in the  absence of any form, you should consider what you want in case the spouse / caregiver is no longer around or capable of making decisions.

## 2022-11-17 ENCOUNTER — Telehealth: Payer: Self-pay | Admitting: Neurology

## 2022-11-17 NOTE — Telephone Encounter (Signed)
Referral for Neuropsychology sent through [facility] to [facility] to Dr. Arley Phenix. Phone: [phone]

## 2022-11-19 LAB — ATN PROFILE
A -- Beta-amyloid 42/40 Ratio: 0.09 — ABNORMAL LOW (ref >0.102)
Beta-amyloid 40: 229.44 pg/mL
Beta-amyloid 42: 20.72 pg/mL
N -- NfL, Plasma: 2.86 pg/mL (ref 0.00–7.64)
T -- p-tau181: 1.48 pg/mL — ABNORMAL HIGH (ref 0.00–0.97)

## 2022-11-19 LAB — TSH: TSH: 1.01 u[IU]/mL (ref 0.450–4.500)

## 2022-11-19 NOTE — Progress Notes (Signed)
Please call and advise the patient that the recent dementia labs we checked show presence of Alzheimer Disease biomarker's. Please inform patient/family that over time, she will have worsening of her memory. To slow down the progression of the disease we can add low dose Aricept, 5 mg nightly  Please remind patient to keep any upcoming appointments or tests and to call us with any interim questions, concerns, problems or updates. Thanks,   Windell Norfolk, MD

## 2022-11-29 ENCOUNTER — Telehealth: Payer: Self-pay

## 2022-11-29 MED ORDER — DONEPEZIL HCL 5 MG PO TABS: 5.0000 mg | ORAL_TABLET | Freq: Every day | ORAL | 3 refills | Status: DC

## 2022-11-29 NOTE — Telephone Encounter (Signed)
Called and Spoke to patient's daughter about recent lab results and would like the Aricept sent to her pharmacy. Pt verbalized understanding. Pt had no questions at this time but was encouraged to call back if questions arise.

## 2022-11-29 NOTE — Telephone Encounter (Signed)
-----   Message from Alric Ran, MD sent at 11/19/2022  9:11 AM EST ----- Please call and advise the patient that the recent dementia labs we checked show presence of Alzheimer Disease biomarker's. Please inform patient/family that over time, she will have worsening of her memory. To slow down the progression of the disease we can add low dose Aricept, 5 mg nightly  Please remind patient to keep any upcoming appointments or tests and to call us with any interim questions, concerns, problems or updates. Thanks,   Alric Ran, MD

## 2022-11-29 NOTE — Addendum Note (Signed)
Addended by: Kristen Loader on: 11/29/2022 01:43 PM   Modules accepted: Orders

## 2022-11-30 ENCOUNTER — Encounter: Payer: Self-pay | Admitting: Psychology

## 2023-05-11 DIAGNOSIS — E785 Hyperlipidemia, unspecified: Secondary | ICD-10-CM | POA: Diagnosis not present

## 2023-05-11 DIAGNOSIS — F0390 Unspecified dementia without behavioral disturbance: Secondary | ICD-10-CM | POA: Diagnosis not present

## 2023-05-11 DIAGNOSIS — M8588 Other specified disorders of bone density and structure, other site: Secondary | ICD-10-CM | POA: Diagnosis not present

## 2023-05-11 DIAGNOSIS — H43399 Other vitreous opacities, unspecified eye: Secondary | ICD-10-CM | POA: Diagnosis not present

## 2023-05-11 DIAGNOSIS — N1831 Chronic kidney disease, stage 3a: Secondary | ICD-10-CM | POA: Diagnosis not present

## 2023-05-11 DIAGNOSIS — Z0000 Encounter for general adult medical examination without abnormal findings: Secondary | ICD-10-CM | POA: Diagnosis not present

## 2023-05-15 ENCOUNTER — Other Ambulatory Visit: Payer: Self-pay

## 2023-05-15 ENCOUNTER — Telehealth: Payer: Self-pay | Admitting: Neurology

## 2023-05-15 MED ORDER — DONEPEZIL HCL 5 MG PO TABS: 5.0000 mg | ORAL_TABLET | Freq: Every day | ORAL | 3 refills | Status: DC

## 2023-05-15 NOTE — Telephone Encounter (Signed)
Pt's daughter, Bushra Mock said pharmacy refuse to fill the prescription because refill is too soon. Please would like a call back.

## 2023-05-15 NOTE — Telephone Encounter (Signed)
Call back to local [facility] and after several attempts finally spoke with pharmacy tech Sharon Elliott, insurance denied override. Sharon Elliott is running a 5 day supply until insurance will cover on friday for the remaining 25 pills. Call to daughter make aware that 5 pills of donepezil 5mg  will be ready those evening and cost is $14.79. Daughter appreciative of call.

## 2023-05-15 NOTE — Telephone Encounter (Signed)
Multiple call to pharmacy and after on hold for nearly an hour and several dropped call, message left with call center to have local store calls our office for override on donepezil due to family misplacing bottle and patient being out of medication for 2 days and having behavior changes.  Call to daughter to inform message was left for pharmacy to return call. Left message

## 2023-05-15 NOTE — Telephone Encounter (Signed)
Patient came in the office with her daughter looking for a refill of Donepezil. The patient picked up the last refill from [facility] 5/29 and it has been misplaced. Are they able to get a refill? Patiens daughter already seesa change and has been off the meds for 2 days. Contact [phone] Osborne Casco daugher who is on the [facility].

## 2023-07-19 ENCOUNTER — Encounter: Payer: Self-pay | Admitting: Psychology

## 2023-07-19 ENCOUNTER — Encounter: Payer: Medicare PPO | Attending: Psychology | Admitting: Psychology

## 2023-07-19 DIAGNOSIS — F09 Unspecified mental disorder due to known physiological condition: Secondary | ICD-10-CM | POA: Insufficient documentation

## 2023-07-19 DIAGNOSIS — R413 Other amnesia: Secondary | ICD-10-CM | POA: Diagnosis not present

## 2023-07-19 DIAGNOSIS — R4189 Other symptoms and signs involving cognitive functions and awareness: Secondary | ICD-10-CM

## 2023-07-19 NOTE — Progress Notes (Signed)
Neuropsychological Consultation   Patient:   Sharon Elliott   DOB:   [birth year]  MR Number:  [id number]  Location:  [facility] FOR PAIN AND [facility] [place] [facility] [address] [place], [address] [place] [address] Dept: [phone]           Date of Service:   07/19/2023  Location of Service and Individuals present: Today's visit was conducted in my outpatient clinic office with the patient, her daughter and myself present.  Start Time:   10 AM End Time:   12 PM  Patient Consent and Confidentiality: Limits of confidentiality were reviewed particularly noting the fact that the patient had been referred for neuropsychological evaluation by her treating neurologist with request for formal report to be produced and report being made available in the patient's electronic medical records.  Patient and daughter consent to go forward with a neuropsychological evaluation.  Consent for Evaluation and Treatment:  Signed:  Yes Explanation of Privacy Policies:  Signed:  Yes Discussion of Confidentiality Limits:  Yes  Provider/Observer:  Arley Phenix, Psy.D.       Clinical Neuropsychologist       Billing Code/Service: 96116/96121  Chief Complaint:     Chief Complaint  Patient presents with   Memory Loss    Reason for Service:    Sharon Elliott is a 77 year old female referred for neuropsychological evaluation by her treating neurologist Windell Norfolk, MD and is being referred for neurological workup by her PCP Irven Coe, MD.  The reason for this referral had to do with progressing and increasing issues primarily with memory and no other significant other cognitive domains being involved with maintenance of expressive and receptive language, motor functions etc.  The patient does appear to be having more difficulties with comprehension, recall of information and a lowered or less and attention span.  While the patient has been able  to maintain ADLs or IADLs have been primarily completed by one of her daughters she is doing almost everything with cleaning, cooking and shopping and taking care of household chores for both the patient and her husband, who is also having this on medical issues.  The patient is having difficulty with both episodic and semantic memory functions and is displaying mild to moderate cognitive decline that is progressing in nature.  Patient has a family history of her mother being diagnosed with Alzheimer's with symptoms developing around 37 but unclear the depth of diagnostic workup for her mother.  Patient denies any tremors or significant change in gait or motor function and has returned to her lifelong activities with ballroom dancing and being as physically active as she can.  Patient's mood state is described as being positive overall.  The patient and her daughter were present for the clinical interview today in my office.  Much of the information was provided by her daughter but confirmed by the patient.  The patient first noticed changes after the patient's mother passed away from Alzheimer's diagnosis.  This happened around 2017.  The patient noted that she felt like her memory was not as sharp as it had been and the patient has been very active and ballroom dancing, social interactions and walking with family members etc.  Symptoms slowly progressed.  Around the time of COVID with the social isolation etc. and the patient not being able to engage in the social activity she had been doing things began to be more progressed and noticed by other family  members.  The patient's daughter really noted changes around November 2020.  There was a sense that the patient's joy in life was removed sadness and loss were evident.  Patient's daughter notes that even with her last visit with her PCP Dr. Lewie Chamber that he noticed more sadness and depressed mood from what he was used to seeing with the patient.  The patient is  described as becoming more reserved and less engaging.  The patient's husband has been through is on stress and trauma with a sibling being murdered around 5 years ago that have really impacted him as well as his own medical status which the daughter feels like is exacerbating the patient's frustration.  However, the family has worked getting the patient back into dance and more involved which appears to be helping.  Diet is described as quite good and the family is particularly focused on a primarily vegan diet with lots of healthy fats, vegetables fruits as well as nuts and seeds.  Patient has been encouraged and engaged in mental exercises and being very active and engaged in physical exercises as well.  Dr. Teresa Coombs has started the patient on a low-dose of Aricept.  The patient's family was initially quite concerned about possible side effects all other family members felt that it would be almost a significant benefit to her functioning.  The patient did end up starting the Aricept and the patient's daughter reports that it seems like it has improved the patient's day-to-day functioning and the patient does not appear to be changing as progressively as previously.  There are no difficulties or changes in sleep noticed but there has been a reduction in appetite to some degree.  Family is making a concerted effort to make sure that she is getting good nutrition and excellent diet as well as continuing to get enough water every day.  The patient's mother developed cognitive change in her mid to late 21s.  The patient started noticing her own changes around 2017 but the family became well aware and noting problems in November 2020.  Because of COVID restriction in place the patient's daughter was doing all of the outside day-to-day activities with a significant change in activities for the patient.  The patient and her daughter deny any changes in motor functions and denies any auditory or visual hallucinations  and there were no descriptions of tremors and I noted no tremors today.  Medical History:   Past Medical History:  Diagnosis Date   Anemia    hx   GERD (gastroesophageal reflux disease)          Patient Active Problem List   Diagnosis Date Noted   GERD 05/13/2009   CHEST PAIN, ATYPICAL 04/10/2009    Onset and Duration of Symptoms: Patient started describing and reporting some very mild cognitive changes around 2017 and the family became quite aware and observant of clear changes in November 2020.  Associated Symptoms (e.g., cognitive, emotional, behavioral): While significant depressive symptomatology Zartan not noted the patient is showing a reduced emotional responsiveness particularly for positive engagement activities with some depressed mood noted by family as well as her PCP.  Patient is not taking any psychotropics and other than vitamin supplements for vitamin D and B12/B6 the only other noted medication is Aricept 5 mg tablet at bedtime.  Additional Tests and Measures from other records:  Neuroimaging Results: No neuroimaging have been performed as the patient does not present with symptoms that would suggest any cerebrovascular type of involvement etc.  Laboratory Tests: Patient has had the ATN profile blood work completed on 11/16/2022 that showed decrease levels in the 80-beta amyloid 42/40 ratio and elevation in PTL 181 noted.  The low beta amyloid ratio and high PT at 181 concentrations are consistent with patterns typically seen with the presence of Alzheimer's related pathology.  Sleep: Sleep is described as good and the patient is awake during the day and asleep at night.  She did report that she has had more vivid dreams since starting Aricept but these are not described as being disturbing or disruptive in any way.  Behavioral Observation/Mental Status:   Sharon Elliott  presents as a 77 y.o.-year-old Right handed African American Female who appeared her stated age.  her dress was Appropriate and she was Well Groomed and her manners were Appropriate to the situation.  her participation was indicative of Appropriate, Inattentive, and Redirectable behaviors.  There were not physical disabilities noted.  she displayed an appropriate level of cooperation and motivation.    Interactions:    Active Inattentive and Redirectable  Attention:   abnormal and attention span appeared shorter than expected for age  Memory:   abnormal; global memory impairment noted  Visuo-spatial:   not examined  Speech (Volume):  low  Speech:   normal; normal  Thought Process:  Coherent and Relevant  Coherent, Directed, and Logical  Though Content:  WNL; not suicidal and not homicidal  Orientation:   person, time/date, and situation  Judgment:   Fair  Planning:   Poor  Affect:    Appropriate  Mood:    Euthymic  Insight:   Fair  Intelligence:   high  Marital Status/Living:  Patient was born and raised in the greater [place] area along with 7 siblings.  Patient's parents were both quite bright with the patient's mother working as a Agricultural engineer and her father working in [employer] and security guard type jobs.  Patient was married in 1973 and has 3 adult daughters age 31, 2 and 30 who are all doing well.  Her youngest daughter has dealt with chronic depression through the years.  The patient currently lives with her husband of 50 years and her youngest daughter.  Educational and Occupational History:     Highest Level of Education:   Patient graduated from high school and had 1 year of college before going into her work.  The patient did very well in music and physical activities like dance and had some relative difficulties with history type classes.  Extracurricular activities including participating in the orchestra.  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  Hobbies and Interests: Walking, ballroom dancing   Psychiatric History: No prior history of psychiatric illness, depression or anxiety type symptoms although some increased anhedonia and melancholy a have been noted by family and medical providers recently.   History of Substance Use or Abuse:  No concerns of substance abuse are reported.  Family Med/Psych History:  Family History  Problem Relation Age of Onset   Breast cancer Other        1st degree relative <50   Stroke Other        F 1st degree relative <60   Hypertension Other    Diabetes Other        1st degree relative    Impression/DX:  ASLAN CRABLE is a 77 year old female referred for neuropsychological evaluation by her treating neurologist Windell Norfolk, MD and is being referred for neurological workup by her PCP Irven Coe, MD.  The reason for this referral had to do with progressing and increasing issues primarily with memory and no other significant other cognitive domains being involved with maintenance of expressive and receptive language, motor functions etc.  The patient does appear to be having more difficulties with comprehension, recall of information and a lowered or less and attention span.  While the patient has been able to maintain ADLs or IADLs have been primarily completed by one of her daughters she is doing almost everything with cleaning, cooking and shopping and taking care of household chores for both the patient and her husband, who is also having this on medical issues.  The patient is having difficulty with both episodic and semantic memory functions and is displaying mild to moderate cognitive decline that is progressing in nature.  Patient has a family history of her mother being diagnosed with Alzheimer's with symptoms developing around 61 but unclear the depth of diagnostic workup for her mother.  Patient denies any  tremors or significant change in gait or motor function and has returned to her lifelong activities with ballroom dancing and being as physically active as she can.  Patient's mood state is described as being positive overall.  Disposition/Plan:  We have set the patient up for formal neuropsychological assessment to contribute to the overall neurological workup with the patient regarding both diagnostic considerations as well as treatment recommendations and care going forward.  The patient will complete a foundational battery of the Wechsler Adult Intelligence Scale's and Wechsler Memory Scale's as well as the controlled oral Word Association desk and grooved pegboard test.  Once these are completed a determination will be made as to any need for additional measures to provide an answer the formal diagnostic questions and treatment recommendations.  I will sit down with the patient and her family and go over the results and going to greater depth regarding specific recommendations for family and patient as well.  Diagnosis:    Cognitive and neurobehavioral dysfunction  Memory loss        Note: This document was prepared using Dragon voice recognition software and may include unintentional dictation errors.   Electronically Signed   _______________________ Arley Phenix, Psy.D. Clinical Neuropsychologist

## 2023-08-02 ENCOUNTER — Encounter: Payer: Medicare PPO | Attending: Psychology

## 2023-08-02 DIAGNOSIS — F09 Unspecified mental disorder due to known physiological condition: Secondary | ICD-10-CM | POA: Diagnosis not present

## 2023-08-02 DIAGNOSIS — R413 Other amnesia: Secondary | ICD-10-CM | POA: Diagnosis not present

## 2023-08-10 NOTE — Progress Notes (Signed)
Behavioral Observations:  The patient appeared well-groomed and appropriately dressed. Her manners were polite and appropriate to the situation. The patient's attitude towards testing was positive and her effort was good.   Neuropsychology Note  Sharon Elliott completed 100 minutes of neuropsychological testing with technician, Sharon Elliott, BA, under the supervision of Arley Phenix, PsyD., Clinical Neuropsychologist. The patient did not appear overtly distressed by the testing session, per behavioral observation or via self-report to the technician. Rest breaks were offered.   Clinical Decision Making: In considering the patient's current level of functioning, level of presumed impairment, nature of symptoms, emotional and behavioral responses during clinical interview, level of literacy, and observed level of motivation/effort, a battery of tests was selected by Dr. Kieth Brightly during initial consultation on 07/19/2023. This was communicated to the technician. Communication between the neuropsychologist and technician was ongoing throughout the testing session and changes were made as deemed necessary based on patient performance on testing, technician observations and additional pertinent factors such as those listed above.  Tests Administered: Grooved Pegboard Controlled Oral Word Association Test (COWAT; FAS & Animals)  Wechsler Adult Intelligence Scale, 4th Edition (WAIS-IV) Wechsler Memory Scale, 4th Edition (WMS-IV); Older Adult Battery   Results:  Grooved Pegboard:  R (DH) time= 78s Percentile Rank= 48th L (NDH) time=100s  Percentile Rank= 43rd  COWAT:  FAS total= 31 Z= -0.30 Animals total= 15 Z= -0.28   WAIS-IV:  Composite Score Summary  Scale Sum of Scaled Scores Composite Score Percentile Rank 95% Conf. Interval Qualitative Description  Verbal Comprehension 24 VCI 89 23 84-95 Low Average  Perceptual Reasoning 26 PRI 92 30 86-99 Average  Working Memory 13  WMI 80 9 74-88 Low Average  Processing Speed 22 PSI 105 63 96-113 Average  Full Scale 85 FSIQ 89 23 85-93 Low Average  General Ability 50 GAI 89 23 84-94 Low Average   Verbal Comprehension Subtests Summary  Subtest Raw Score Scaled Score Percentile Rank Reference Group Scaled Score SEM  Similarities 18 8 25 6  1.12  Vocabulary 28 8 25 8  0.73  Information 9 8 25 8  0.73  The scaled scores in the Reference Group Scaled Score column are based on the performance of examinees aged 20:0-34:11 (i.e., the reference group). See Chapter 6 of the WAIS-IV Technical and Interpretive Manual for more information.  Perceptual Reasoning Subtests Summary  Subtest Raw Score Scaled Score Percentile Rank Reference Group Scaled Score SEM  Block Design 24 9 37 6 1.27  Matrix Reasoning 11 10 50 6 0.73  Visual Puzzles 7 7 16 5  0.99   Working Librarian, academic Raw Score Scaled Score Percentile Rank Reference Group Scaled Score SEM  Digit Span 20 8 25 6  0.79  Arithmetic 7 5 5 5  0.95   Processing Speed Subtests Summary  Subtest Raw Score Scaled Score Percentile Rank Reference Group Scaled Score SEM  Symbol Search 22 10 50 6 1.12  Coding 54 12 75 7 1.12     WMS-IV:  Index Score Summary  Index Sum of Scaled Scores Index Score Percentile Rank 95% Confidence Interval Qualitative Descriptor  Auditory Memory (AMI) 11 55 0.1 51-63 Extremely Low  Visual Memory (VMI) 8 66 1 62-72 Extremely Low  Immediate Memory (IMI) 13 65 1 61-73 Extremely Low  Delayed Memory (DMI) 6 49 <0.1 45-61 Extremely Low   Primary Subtest Scaled Score Summary  Subtest Domain Raw Score Scaled Score Percentile Rank  Logical Memory I AM 17 6 9   Logical Memory II  AM 0 1 0.1  Verbal Paired Associates I AM 1 1 0.1  Verbal Paired Associates II AM 1 3 1   Visual Reproduction I VM 19 6 9   Visual Reproduction II VM 0 2 0.4  Symbol Span VWM 15 10 50   Auditory Memory Process Score Summary  Process Score Raw Score Scaled Score  Percentile Rank Cumulative Percentage (Base Rate)  LM II Recognition 14 - - 10-16%  VPA II Recognition 14 - - <=2%   Visual Memory Process Score Summary  Process Score Raw Score Scaled Score Percentile Rank Cumulative Percentage (Base Rate)  VR II Recognition 1 - - 3-9%   ABILITY-MEMORY ANALYSIS  Ability Score:  VCI: 89 Date of Testing:  WAIS-IV; WMS-IV 2023/08/02  Predicted Difference Method   Index Predicted WMS-IV Index Score Actual WMS-IV Index Score Difference Critical Value  Significant Difference Y/N Base Rate  Auditory Memory 94 55 39 9.39 Y <1%  Visual Memory 95 66 29 8.28 Y 2%  Immediate Memory 94 65 29 10.49 Y 1%  Delayed Memory 94 49 45 12.08 Y <1%  Statistical significance (critical value) at the .01 level.    Feedback to Patient: Sharon Elliott will return on 04/24/2024 for an interactive feedback session with Dr. Kieth Brightly at which time her test performances, clinical impressions and treatment recommendations will be reviewed in detail. The patient understands she can contact our office should she require our assistance before this time.  100 minutes spent face-to-face with patient administering standardized tests, 12 minutes spent scoring Radiographer, therapeutic). [CPT [id number], [id number]]  Full report to follow.

## 2023-09-02 ENCOUNTER — Other Ambulatory Visit: Payer: Self-pay | Admitting: Neurology

## 2023-09-07 ENCOUNTER — Encounter: Payer: Medicare PPO | Attending: Psychology | Admitting: Psychology

## 2023-09-07 DIAGNOSIS — G301 Alzheimer's disease with late onset: Secondary | ICD-10-CM | POA: Diagnosis not present

## 2023-09-07 DIAGNOSIS — G309 Alzheimer's disease, unspecified: Secondary | ICD-10-CM | POA: Insufficient documentation

## 2023-09-07 DIAGNOSIS — F02B0 Dementia in other diseases classified elsewhere, moderate, without behavioral disturbance, psychotic disturbance, mood disturbance, and anxiety: Secondary | ICD-10-CM | POA: Diagnosis not present

## 2023-09-07 DIAGNOSIS — F02B3 Dementia in other diseases classified elsewhere, moderate, with mood disturbance: Secondary | ICD-10-CM | POA: Diagnosis not present

## 2023-09-11 ENCOUNTER — Encounter: Payer: Self-pay | Admitting: Psychology

## 2023-09-11 NOTE — Progress Notes (Signed)
Neuropsychological Evaluation   Patient:  Sharon Elliott   DOB: 08-Mar-1946  MR Number: [id number]  Location: [facility] [address] [place], [address] [place] [address] Dept: [phone]  Start: 2 PM End: 3 PM  Provider/Observer:     Hershal Coria PsyD  Chief Complaint:      Chief Complaint  Patient presents with   Memory Loss    Reason For Service:     Sharon Elliott is a 77 year old female referred for neuropsychological evaluation by her treating neurologist Windell Norfolk, MD after being referred for neurological workup by her PCP Irven Coe, MD.  The reason for this referral had to do with progressing and increasing issues primarily with memory and no other significant changes other cognitive domains being reported.  The patient describes the maintenance of expressive and receptive language, motor functions etc.  The patient does appear to be having more difficulties with comprehension, recall of information and reported some changes in attention/concentration.  While the patient has been able to maintain ADLs or IADLs the patient's daughter reports that these have been primarily completed by one of her daughters and the daughters are doing almost everything with cleaning, cooking and shopping and taking care of household chores for both the patient and her husband.  The patient's husband is also having medical issues.  The patient is having difficulty with both episodic and semantic memory functions and is displaying mild to moderate cognitive decline that is progressing in nature.  Patient has a family history of her mother being diagnosed with Alzheimer's with symptoms developing around 69 but unclear the depth of diagnostic workup for her mother.  Patient denies any tremors or significant change in gait or motor function and has returned to her lifelong activities with ballroom dancing  and being as physically active as she can.  Patient's mood state is described as being positive overall.   The patient and her daughter were present for the clinical interview today in my office.  Much of the information was provided by her daughter but confirmed by the patient.  The patient first noticed changes after the patient's mother passed away from Alzheimer's complications and degenerative process.  This happened around 2017.  The patient noted that she felt like her memory was not as sharp as it had been and the patient has been very active in ballroom dancing, social interactions and walking with family members etc. patient's daughter reports that symptoms have slowly progressed.  Around the time of COVID with the social isolation etc. and the patient not being able to engage in the social activity, she had been having cognitive and memory changes that appeared to progress and were noticed by other family members.  The patient's daughter really noted changes around November 2020.  There was a sense that the patient's joy in life was removed and sadness and loss were evident.  Patient's daughter notes that even with the patient's last visit with her PCP Dr. Lewie Chamber, that he noticed more sadness and depressed mood from what he was used to seeing with the patient.  The patient is described as becoming more reserved and less engaging.  The patient's husband has been through his on stress and trauma with a sibling being murdered around 5 years ago that have really impacted him as well as his own medical status, which the daughter feels like is exacerbating the patient's frustration.  However, the  family has worked getting the patient back into dance and more involved, which appears to be helping.  Diet is described as quite good and the family is particularly focused on a primarily vegan diet with lots of healthy fats, vegetables fruits as well as nuts and seeds.  Patient has been encouraged and engaged in  mental exercises and being very active and engaged in physical exercises as well.   Dr. Teresa Coombs has started the patient on a low-dose of Aricept.  The patient's family was initially quite concerned about possible side effects but family members feel that it has been of significant benefit to her functioning.  The patient did end up starting the Aricept and the patient's daughter reports that it seems like it has improved the patient's day-to-day functioning and the patient does not appear to be changing as progressively as previously.  There are no difficulties or changes in sleep noticed but there has been a reduction in appetite to some degree.  Family is making a concerted effort to make sure that she is getting good nutrition and excellent diet as well as continuing to get enough water every day.   The patient's mother developed cognitive change in her mid to late 67s.  The patient started noticing her own changes around 2017 but the family became well aware and noting problems in November 2020.  Because of COVID restriction in place the patient's daughter was doing all of the outside day-to-day activities with a significant change in activities for the patient.  The patient and her daughter deny any changes in motor functions and denies any auditory or visual hallucinations and there were no descriptions of tremors and I noted no tremors today.   Medical History:                         Past Medical History:  Diagnosis Date   Anemia      hx   GERD (gastroesophageal reflux disease)                                                                 Patient Active Problem List    Diagnosis Date Noted   GERD 05/13/2009   CHEST PAIN, ATYPICAL 04/10/2009      Onset and Duration of Symptoms: Patient started describing and reporting some very mild cognitive changes around 2017 and the family became quite aware and observant of clear changes in November 2020.   Associated Symptoms (e.g.,  cognitive, emotional, behavioral): While significant depressive symptomatology have been noted, the patient is showing a reduced emotional responsiveness particularly for positive engagement activities with some depressed mood noted by family as well as her PCP.  Patient is not taking any psychotropics and other than vitamin supplements for vitamin D and B12/B6 the only other noted medication is Aricept 5 mg tablet at bedtime.   Additional Tests and Measures from other records:   Neuroimaging Results: No neuroimaging have been performed as the patient does not present with symptoms that would suggest any cerebrovascular type of involvement etc.   Laboratory Tests: Patient has had the ATN profile blood work completed on 11/16/2022 that showed decrease levels in the 80-beta amyloid 42/40 ratio and elevation in PTL 181 noted.  The low  beta amyloid ratio and high PT at 181 concentrations are consistent with patterns typically seen with the presence of Alzheimer's related pathology.   Sleep: Sleep is described as good and the patient is awake during the day and asleep at night.  She did report that she has had more vivid dreams since starting Aricept but these are not described as being disturbing or disruptive in any way.  Tests Administered: Grooved Pegboard Controlled Oral Word Association Test (COWAT; FAS & Animals)  Wechsler Adult Intelligence Scale, 4th Edition (WAIS-IV) Wechsler Memory Scale, 4th Edition (WMS-IV); Older Adult Battery   Participation Level:   Active  Participation Quality:  Appropriate      Behavioral Observation:  The patient appeared well-groomed and appropriately dressed. Her manners were polite and appropriate to the situation. The patient's attitude towards testing was positive and her effort was good.   Well Groomed, Alert, and Appropriate.   Test Results:   Initially, an estimation was made as to the patient's premorbid intellectual and cognitive functioning to  provide a comparison point for analyzing current obtained objective neuropsychological test data.  The patient graduated from high school and had 1 year of college before going into her employment.  The patient is described as always doing very well in music and physical activities like dance.  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  It is estimated that the patient has performed premorbidly at least in the average range if not high average with some particular skills in some domains.  We will utilize a estimate of around 110 for composite scores and the patient performing likely around the 65th percentile and cognitive domains and at least the average to high average range as far as psychomotor skills.  Also of consideration was the validity of the current assessment.  The patient appeared to try her hardest throughout and maintain good effort and a positive behavioral pattern.  Embedded validity checks do not suggest any attempts to exaggerate or minimize any current symptomatology and this does appear to be a fair and valid assessment of the patient's current status.  Grooved Pegboard:  R (DH) time= 78s Percentile Rank= 48th L (NDH) time=100s  Percentile Rank= 43rd In order to assess fine motor control and potential for any lateralization of findings the patient was administered the grooved pegboard test.  The patient performed well and in the average range relative to a age, gender and education matched control group.  There were no indications of any deficits and no indications of any lateralization of findings.   COWAT:  FAS total= 31 Z= -0.30 Animals total= 15 Z= -0.28  The patient and family have also denied any significant deficits with regard to expressive language capacity or word finding abilities and the patient performed in the average range for both measures of lexical fluency and semantic fluency.   These performances are generally consistent with their education and occupational histories.   WAIS-IV:            Composite Score Summary          Scale Sum of Scaled Scores Composite Score Percentile Rank 95% Conf. Interval Qualitative Description  Verbal Comprehension 24 VCI 89 23 84-95 Low Average  Perceptual Reasoning 26 PRI 92 30 86-99 Average  Working Memory 13 WMI 80 9 74-88 Low Average  Processing Speed 22 PSI 105 63 96-113 Average  Full Scale 85 FSIQ 89 23 85-93 Low  Average  General Ability 50 GAI 89 23 84-94 Low Average    The patient was administered the Wechsler Adult Intelligence Scale-IV in order to provide an objective assessment of a wide range of cognitive domains in a systematic way with measures that are quite standardized and available for repeat testing if warranted in the future.  As the patient and her family are reporting cognitive changes over the past several years the patient is current obtained cognitive score should not be viewed as representing lifelong cognitive abilities but a descriptor of her current status.  2 Global composite scores were computed.  The patient produced a full-scale IQ score of 89 which falls at the 23rd percentile and in the low average range relative to a normative population.  This is roughly 20 standard points below predicted levels and is greater than 1 standard deviation below predicted levels of premorbid functioning.  This would suggest that 1 or more cognitive domains are being impacted and are changed from her premorbid functioning levels.  We also calculated the patient's general abilities index score which was also an 58 and in the 23rd percentile would suggest that this is more than simply changes in attention and information processing speed but multiple domains are being impacted.          Verbal Comprehension Subtests Summary        Subtest Raw Score Scaled Score Percentile Rank Reference Group Scaled Score SEM  Similarities  18 8 25 6  1.12  Vocabulary 28 8 25 8  0.73  Information 9 8 25 8  0.73   The patient produced a verbal comprehension index score of 89 which falls at the 23rd percentile and is in the low average range relative to a normative population.  There was little scatter noted in subtest performance with all measures in the low average range related to verbal reasoning and problem-solving, vocabulary knowledge and retrieval of information in the patient's general fund of information.           Perceptual Reasoning Subtests Summary        Subtest Raw Score Scaled Score Percentile Rank Reference Group Scaled Score SEM  Block Design 24 9 37 6 1.27  Matrix Reasoning 11 10 50 6 0.73  Visual Puzzles 7 7 16 5  0.99          Working Comptroller Raw Score Scaled Score Percentile Rank Reference Group Scaled Score SEM  Digit Span 20 8 25 6  0.79  Arithmetic 7 5 5 5  0.95          Processing Speed Subtests Summary        Subtest Raw Score Scaled Score Percentile Rank Reference Group Scaled Score SEM  Symbol Search 22 10 50 6 1.12  Coding 54 12 75 7 1.12    The patient produced a perceptual reasoning index score of 92 which falls at the 30th percentile and is in the average range.  This is more than 1 standard deviation below predicted levels of performance although there was considerable scatter noted in subtest performance.  The patient performed in the average range on measures of her capacity to analyze geometric patterns and visual analysis and organizational skills.  She also performed in the average range and generally consistent with premorbid estimates on measures of nonverbal reasoning skills and broad visual intelligence.  The patient had her greatest difficulties on measures of fluid reasoning and problem solving and thinking and visual patterns.  The primary visual processing deficit had to do with fluid reasoning skills rather than significant visual spatial or visual  constructional deficits.  The patient produced a working memory index score of 80 which falls at the 9th percentile and is in the low average range.  Given the patient's occupational and educational history this is significantly below predicted levels of premorbid functioning.  She was mildly impaired relative to premorbid estimates with regard to primary auditory encoding capacity and showed primary weakness and deficit when required to actively process information in her auditory Register.  The patient produced a processing speed index score of 105 which falls at the 63rd percentile and is in the average range.  This is much more consistent with estimates of premorbid functioning.  The patient did well on measures of visual scanning, visual searching and overall speed of mental operations and shows quite well retained focus execute abilities and information processing capacity.   WMS-IV:          Index Score Summary        Index Sum of Scaled Scores Index Score Percentile Rank 95% Confidence Interval Qualitative Descriptor  Auditory Memory (AMI) 11 55 0.1 51-63 Extremely Low  Visual Memory (VMI) 8 66 1 62-72 Extremely Low  Immediate Memory (IMI) 13 65 1 61-73 Extremely Low  Delayed Memory (DMI) 6 49 <0.1 45-61 Extremely Low    The patient was also administered the Wechsler Memory Scale-IV to provide a thorough and systematic assessment of a wide range of learning and memory domains.  On the Wechsler Adult Intelligence Scale, the patient did well on primary auditory encoding measures.  The patient had a very similar performance with regard to visual encoding capacity.  She did have some difficulties with actively processing information in her auditory and visual Register but her primary encoding capacities were generally consistent with premorbid estimates.  This level of encoding capacity should not have a significant impact in her capacity to store and organize new information either visually or  auditorily.  Breaking memory functions down between auditory versus visual memory the patient produced an auditory memory index score of 55 which falls below the 1st percentile and is in the extreme low range relative to a normative population.  While not quite as impaired a very similar performance was also noted with regard to visual memory as she produced a visual memory index score of 66 which falls at the 1st percentile and also in the extremely low range relative to a normative population.  Breaking memory functions down between immediate versus delayed memory the patient produced an immediate memory index score of 65 which is at the 1st percentile and in the extremely low range.  The patient showed severe deficits not only initially storing and organizing new information (as the patient had good visual and auditory encoding capacity) she showed a significant loss of even that limited amount of information over period of delay.  The patient showed very little improvement under recognition/cued recall formats both visually and auditorily strongly suggesting that the primary deficits have to do with storage and organization of new information and are not simply a reflection of attentional variables such as encoding or focus execute abilities.  I also calculated the patient's ability-memory analysis utilizing the patient's verbal comprehension index score to create a predictor of performance on various memory indices.  The patient consistently performed roughly 2 standard deviations or greater below predicted levels of her memory functions even with the mild deficits noted with regard  to verbal comprehension and verbal language based skills.         Primary Subtest Scaled Score Summary       Subtest Domain Raw Score Scaled Score Percentile Rank  Logical Memory I AM 17 6 9   Logical Memory II AM 0 1 0.1  Verbal Paired Associates I AM 1 1 0.1  Verbal Paired Associates II AM 1 3 1   Visual Reproduction I  VM 19 6 9   Visual Reproduction II VM 0 2 0.4  Symbol Span VWM 15 10 50          Auditory Memory Process Score Summary      Process Score Raw Score Scaled Score Percentile Rank Cumulative Percentage (Base Rate)  LM II Recognition 14 - - 10-16%  VPA II Recognition 14 - - <=2%         Visual Memory Process Score Summary      Process Score Raw Score Scaled Score Percentile Rank Cumulative Percentage (Base Rate)  VR II Recognition 1 - - 3-9%    ABILITY-MEMORY ANALYSIS   Ability Score:    VCI: 89 Date of Testing:           WAIS-IV; WMS-IV 2023/08/02             Predicted Difference Method    Index Predicted WMS-IV Index Score Actual WMS-IV Index Score Difference Critical Value   Significant Difference Y/N Base Rate  Auditory Memory 94 55 39 9.39 Y <1%  Visual Memory 95 66 29 8.28 Y 2%  Immediate Memory 94 65 29 10.49 Y 1%  Delayed Memory 94 49 45 12.08 Y <1%    Summary of Results:   Overall, the results of the current neuropsychological evaluation do show generally well-preserved information processing speed, expressive language capacities for both lexical and semantic fluency, visual-spatial and visual constructional capacity and fine motor function.  The patient shows a mild loss in overall global cognitive functioning and specific significant deficits with regard to fluid reasoning capacity and her ability to process information in her auditory and visual attentional Register.  The patient's primary encoding appears to be well-maintained.  There is a general mild to moderate loss in global cognitive functioning.  Of greatest concern with the significant profound deficits with regard to the patient's capacity to learn new information both auditorily and visually.  While the patient performed generally in the average range on measures of primary auditory and visual encoding she did have difficulties processing information in her active Register.  Even with this taking into account the  patient showed severe and profound deficits for both auditory and visual memory showing limited capacity to store and organize new information and there was a significant loss of even this limited information after period of delay.  Impression/Diagnosis:   The results of the current neuropsychological evaluation are consistent with subjective reports and even represent greater memory deficits than her being reported are perceived.  The patient's objective neuropsychological assessment clearly indicate a preserved capacity to encode new information and attend to it and process the information in a timely manner.  However, when asked to process and store/organize that information there are severe and significant deficits for both auditory and visual memory domains.  Even with the limited information that is initially stored and organized it is quickly lost and the patient is showing profound deficits retrieving information after period of delay.  The patient is showing significant deficits in fluid reasoning and problem-solving ability and mild global losses of  cognition with generally well-preserved information processing speed, expressive language capacity, visual-spatial and visual constructional capacity.  The patient does show some abnormalities on her ATN blood work consistent with an Alzheimer's type pathology if there were cognitive deficits present.  In this case there are significant memory and learning deficits with generally well-preserved expressive language capacity and significant deficits in actively processing information and fluid reasoning capacity.  As far as diagnostic considerations, the patient does present with symptoms consistent with an Alzheimer's type pathology that is slowly progressed over the past 5 or 6 years after becoming clearly noticeable by 2020 if not earlier.  This slow progression would be consistent with a late onset pattern.  Patient's family have reported some  improvements in functioning with the addition of Aricept.  The patient also is showing some changes in mood and behavioral patterns more consistent with depressed mood rather than change in fundamental personality styles.  As far as treatment recommendations, I encouraged the patient and family to continue to work with her neurologist as he is providing good care and on the right track as far as treatment recommendations and strategies.  The patient may benefit from the addition of an SSRI medication to help with mood although some of these mood changes may likely be also part of her late onset Alzheimer's type pathology.  I will leave it up to the patient's neurologist about whether the patient would be an appropriate candidate for Lecanemab or other neurological interventions.  The patient's family appear to be approaching this in the very efficient and appropriate manner providing help to the patient as far as her IADLs and continuing to engage the patient in physical activity having the patient return to her dance activities which she had done throughout her life.  The patient is also being monitored as far as her diet and is maintaining a very good diet and has good support from family.  I will sit down with the patient and family and go over the results of the current neuropsychological evaluation and talk about specific recommendations for them beyond continuing to work with her treating neurologist.   Diagnosis:    Moderate major neurocognitive disorder due to Alzheimer disease without behavioral disturbance (HCC)  Moderate late onset Alzheimer's dementia with mood disturbance (HCC)   _____________________ Arley Phenix, Psy.D. Clinical Neuropsychologist

## 2023-09-14 ENCOUNTER — Other Ambulatory Visit: Payer: Self-pay | Admitting: Neurology

## 2023-09-22 ENCOUNTER — Other Ambulatory Visit (HOSPITAL_BASED_OUTPATIENT_CLINIC_OR_DEPARTMENT_OTHER): Payer: Self-pay

## 2023-09-22 MED ORDER — COVID-19 MRNA VAC-TRIS(PFIZER) 30 MCG/0.3ML IM SUSY
0.3000 mL | PREFILLED_SYRINGE | Freq: Once | INTRAMUSCULAR | 0 refills | Status: AC
  Filled 2023-09-22: qty 0.3, 1d supply, fill #0

## 2023-09-22 MED ORDER — INFLUENZA VAC A&B SURF ANT ADJ 0.5 ML IM SUSY
0.5000 mL | PREFILLED_SYRINGE | Freq: Once | INTRAMUSCULAR | 0 refills | Status: AC
  Filled 2023-09-22: qty 0.5, 1d supply, fill #0

## 2023-11-08 ENCOUNTER — Telehealth: Payer: Self-pay

## 2023-11-08 ENCOUNTER — Ambulatory Visit: Payer: Medicare PPO | Admitting: Neurology

## 2023-11-08 ENCOUNTER — Encounter: Payer: Self-pay | Admitting: Neurology

## 2023-11-08 DIAGNOSIS — G301 Alzheimer's disease with late onset: Secondary | ICD-10-CM | POA: Diagnosis not present

## 2023-11-08 DIAGNOSIS — F02A0 Dementia in other diseases classified elsewhere, mild, without behavioral disturbance, psychotic disturbance, mood disturbance, and anxiety: Secondary | ICD-10-CM | POA: Diagnosis not present

## 2023-11-08 MED ORDER — DONEPEZIL HCL 10 MG PO TABS: 10.0000 mg | ORAL_TABLET | Freq: Every day | ORAL | 11 refills | Status: DC

## 2023-11-08 NOTE — Patient Instructions (Signed)
Continue with Aricept 10 mg nightly refill given Continue your other medications  Continue to follow up with PCP  Return as needed   There are well-accepted and sensible ways to reduce risk for Alzheimers disease and other degenerative brain disorders .  Exercise Daily Walk A daily 20 minute walk should be part of your routine. Disease related apathy can be a significant roadblock to exercise and the only way to overcome this is to make it a daily routine and perhaps have a reward at the end (something your loved one loves to eat or drink perhaps) or a personal trainer coming to the home can also be very useful. Most importantly, the patient is much more likely to exercise if the caregiver / spouse does it with him/her. In general a structured, repetitive schedule is best.  General Health: Any diseases which effect your body will effect your brain such as a pneumonia, urinary infection, blood clot, heart attack or stroke. Keep contact with your primary care doctor for regular follow ups.  Sleep. A good nights sleep is healthy for the brain. Seven hours is recommended. If you have insomnia or poor sleep habits we can give you some instructions. If you have sleep apnea wear your mask.  Diet: Eating a heart healthy diet is also a good idea; fish and poultry instead of red meat, nuts (mostly non-peanuts), vegetables, fruits, olive oil or canola oil (instead of butter), minimal salt (use other spices to flavor foods), whole grain rice, bread, cereal and pasta and wine in moderation.Research is now showing that the MIND diet, which is a combination of The Mediterranean diet and the DASH diet, is beneficial for cognitive processing and longevity. Information about this diet can be found in The MIND Diet, a book by Alonna Minium, MS, RDN, and online at [employer]  Finances, Power of [address] and Advance Directives: You should consider putting legal safeguards in place with  regard to financial and medical decision making. While the spouse always has power of attorney for medical and financial issues in the absence of any form, you should consider what you want in case the spouse / caregiver is no longer around or capable of making decisions.

## 2023-11-08 NOTE — Progress Notes (Signed)
[facility]  PATIENT: Sharon Elliott DOB: March 18, 1946  REQUESTING CLINICIAN: Irven Coe, MD HISTORY FROM: Patient and daughter  REASON FOR VISIT: Memory loss    HISTORICAL  CHIEF COMPLAINT:  Chief Complaint  Patient presents with   Memory Loss    Rm13, daughter, Memory loss:moca was 22/30 wants to know if ok to drive, Had side effects from lab draw atn profile: dizziness, that worsened, severe vertigo, nausea, vomitting, disorientated.    INTERVAL HISTORY 11/08/2023 Patient presents today for follow-up, she is accompanied by her daughter.  Last visit was a year ago, and since then she has followed with Dr. Zoila Shutter, completed neuropsychological testing and was diagnosed with major cognitive impairment likely due to Alzheimer disease.  She was prescribed Aricept 5 mg nightly but daughter is telling me that patient has not been taking the medication for the past month. She was doing well when taking the Aricept. They tell me things at home are the same, and they are stable. Patient live with one of her daughters who dos all the cooking, cleaning, drive them around and handle the bills.   HISTORY OF PRESENT ILLNESS:  This is a 77 year old woman with history of hyperlipidemia who is presenting with memory decline.  Patient feels like her memory is not what it used to be.  She also reports since COVID pandemic she has not been as active as before. She feels stuck. 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  During the pandemic she was stuck in the house and since then they have noted that her memory has changed.  She has difficulty sometimes recalling recent conversation, she also have word finding difficulty  Daughter reports that she has to remind her multiple times, for instance for today's appointment she did remind her again.  At home, daughter is doing almost everything with cleaning, cooking, and shopping, and mowing the  lawn.  They report that their father/husband is also sick and has decreased mobility  Patient is able to care for herself, they do not report any difficulty with using items around the house.  Daughter has taken over some of her bills.   TBI:   No past history of TBI Stroke:   no past history of stroke Seizures:   no past history of seizures Sleep:   no history of sleep apnea.  Mood:  patient denies anxiety and depression Family history of Dementia: Mother in her 64s   Functional status: Dependent in some IADLs.  Patient lives with husband and daughter. Cooking: daughter  Cleaning: daughter  Shopping: daughter  Bathing: patient, no help needed  Toileting: patient, no help needed Driving: yes, no accident  Bills: Husband, daughter has also been helping paying patient personal bills   Ever left the stove on by accident?: Denies  Forget how to use items around the house?: No  Getting lost going to familiar places?: Denies  Forgetting loved ones names?: Denies  Word finding difficulty? Yes Sleep: Good    OTHER MEDICAL CONDITIONS: Hyperlipidemia    REVIEW OF SYSTEMS: Full 14 system review of systems performed and negative with exception of: As noted in the HPI   ALLERGIES: No Known Allergies  HOME MEDICATIONS: Outpatient Medications Prior to Visit  Medication Sig Dispense Refill   Cholecalciferol (VITAMIN D-3 PO) Take 1 tablet by mouth daily.     Cyanocobalamin (VITAMIN B-12 PO) Take 1 capsule by mouth daily.     NON FORMULARY Take 3 tablets  by mouth daily. cocoa via supplement for memory     donepezil (ARICEPT) 5 MG tablet TAKE 1 TABLET(5 MG) BY MOUTH AT BEDTIME 30 tablet 3   Pyridoxine HCl (VITAMIN B-6 PO) Take by mouth.     No facility-administered medications prior to visit.    PAST MEDICAL HISTORY: Past Medical History:  Diagnosis Date   Anemia    hx   GERD (gastroesophageal reflux disease)     PAST SURGICAL HISTORY: History reviewed. No pertinent surgical  history.  FAMILY HISTORY: Family History  Problem Relation Age of Onset   Breast cancer Other        1st degree relative <50   Stroke Other        F 1st degree relative <60   Hypertension Other    Diabetes Other        1st degree relative    SOCIAL HISTORY: Social History   Socioeconomic History   Marital status: Married    Spouse name: Not on file   Number of children: Not on file   Years of education: Not on file   Highest education level: Not on file  Occupational History   Not on file  Tobacco Use   Smoking status: Never   Smokeless tobacco: Not on file  Substance and Sexual Activity   Alcohol use: No   Drug use: Not Currently   Sexual activity: Not Currently  Other Topics Concern   Not on file  Social History Narrative   Not on file   Social Determinants of Health   Financial Resource Strain: Not on file  Food Insecurity: Not on file  Transportation Needs: Not on file  Physical Activity: Not on file  Stress: Not on file  Social Connections: Not on file  Intimate Partner Violence: Not on file    PHYSICAL EXAM  GENERAL EXAM/CONSTITUTIONAL: Vitals:  There were no vitals filed for this visit.  There is no height or weight on file to calculate BMI. Wt Readings from Last 3 Encounters:  11/16/22 126 lb (57.2 kg)  05/13/09 158 lb 8 oz (71.9 kg)   Patient is in no distress; well developed, nourished and groomed; neck is supple  MUSCULOSKELETAL: Gait, strength, tone, movements noted in Neurologic exam below  NEUROLOGIC: MENTAL STATUS:      No data to display            11/08/2023    1:43 PM 11/16/2022    1:25 PM  Montreal Cognitive Assessment   Visuospatial/ Executive (0/5) 5 5  Naming (0/3) 2 3  Attention: Read list of digits (0/2) 2 2  Attention: Read list of letters (0/1) 1 1  Attention: Serial 7 subtraction starting at 100 (0/3) 3 3  Language: Repeat phrase (0/2) 2 2  Language : Fluency (0/1) 1 1  Abstraction (0/2) 2 1  Delayed  Recall (0/5) 0 0  Orientation (0/6) 4 2  Total 22 20  Adjusted Score (based on education)  20    CRANIAL NERVE:  2nd, 3rd, 4th, 6th- visual fields full to confrontation, extraocular muscles intact, no nystagmus 5th - facial sensation symmetric 7th - facial strength symmetric 8th - hearing intact 9th - palate elevates symmetrically, uvula midline 11th - shoulder shrug symmetric 12th - tongue protrusion midline  MOTOR:  normal bulk and tone, full strength in the BUE, BLE  SENSORY:  normal and symmetric to light touch  COORDINATION:  finger-nose-finger, fine finger movements normal  GAIT/STATION:  normal   DIAGNOSTIC DATA (LABS,  IMAGING, TESTING) - I reviewed patient records, labs, notes, testing and imaging myself where available.  Lab Results  Component Value Date   HGB 13.6 03/28/2009   HCT 40.0 03/28/2009      Component Value Date/Time   NA 140 03/28/2009 1526   K 4.4 03/28/2009 1526   CL 106 03/28/2009 1526   GLUCOSE 94 03/28/2009 1526   BUN 22 03/28/2009 1526   CREATININE 1.2 03/28/2009 1526   No results found for: "CHOL", "HDL", "LDLCALC", "LDLDIRECT", "TRIG", "CHOLHDL" No results found for: "HGBA1C" No results found for: "VITAMINB12" Lab Results  Component Value Date   TSH 1.010 11/16/2022    Vitamin B12  > 1550   04/23/2021  ATN PROFILE: Positive to Alzheimer disease biomarkers.   Neuropsychiatry Impression/Diagnosis:                      The results of the current neuropsychological evaluation are consistent with subjective reports and even represent greater memory deficits than her being reported are perceived.  The patient's objective neuropsychological assessment clearly indicate a preserved capacity to encode new information and attend to it and process the information in a timely manner.  However, when asked to process and store/organize that information there are severe and significant deficits for both auditory and visual memory domains.  Even  with the limited information that is initially stored and organized it is quickly lost and the patient is showing profound deficits retrieving information after period of delay.  The patient is showing significant deficits in fluid reasoning and problem-solving ability and mild global losses of cognition with generally well-preserved information processing speed, expressive language capacity, visual-spatial and visual constructional capacity.  The patient does show some abnormalities on her ATN blood work consistent with an Alzheimer's type pathology if there were cognitive deficits present.  In this case there are significant memory and learning deficits with generally well-preserved expressive language capacity and significant deficits in actively processing information and fluid reasoning capacity.   As far as diagnostic considerations, the patient does present with symptoms consistent with an Alzheimer's type pathology that is slowly progressed over the past 5 or 6 years after becoming clearly noticeable by 2020 if not earlier.  This slow progression would be consistent with a late onset pattern.  Patient's family have reported some improvements in functioning with the addition of Aricept.  The patient also is showing some changes in mood and behavioral patterns more consistent with depressed mood rather than change in fundamental personality styles.   As far as treatment recommendations, I encouraged the patient and family to continue to work with her neurologist as he is providing good care and on the right track as far as treatment recommendations and strategies.  The patient may benefit from the addition of an SSRI medication to help with mood although some of these mood changes may likely be also part of her late onset Alzheimer's type pathology.  I will leave it up to the patient's neurologist about whether the patient would be an appropriate candidate for Lecanemab or other neurological interventions.   The patient's family appear to be approaching this in the very efficient and appropriate manner providing help to the patient as far as her IADLs and continuing to engage the patient in physical activity having the patient return to her dance activities which she had done throughout her life.  The patient is also being monitored as far as her diet and is maintaining a very good diet and  has good support from family.   I will sit down with the patient and family and go over the results of the current neuropsychological evaluation and talk about specific recommendations for them beyond continuing to work with her treating neurologist.     Diagnosis:                                Moderate major neurocognitive disorder due to Alzheimer disease without behavioral disturbance (HCC)   Moderate late onset Alzheimer's dementia with mood disturbance (HCC)    ASSESSMENT AND PLAN  77 y.o. year old female with history of hyperlipidemia who is presenting for follow-up for her memory loss.  Her ATN profile was positive for Alzheimer disease biomarkers.  She did follow-up with Dr. Kieth Brightly, diagnosed with major cognitive impairment.  We discussed the diagnosis, what it means for patient and treatment plan.  Currently she is not taking the Aricept but when she was taking it, daughter reports that she was doing well.  Plan will be to restart Aricept 5 mg nightly, and increase to 10 mg nightly.  They understand to continue following with Dr. Lewie Chamber, PCP and to return for any questions/concerns or worsening of dementia.   1. Mild late onset Alzheimer's dementia without behavioral disturbance, psychotic disturbance, mood disturbance, or anxiety (HCC)      Patient Instructions  Continue with Aricept 10 mg nightly refill given Continue your other medications  Continue to follow up with PCP  Return as needed   There are well-accepted and sensible ways to reduce risk for Alzheimers disease and other  degenerative brain disorders .  Exercise Daily Walk A daily 20 minute walk should be part of your routine. Disease related apathy can be a significant roadblock to exercise and the only way to overcome this is to make it a daily routine and perhaps have a reward at the end (something your loved one loves to eat or drink perhaps) or a personal trainer coming to the home can also be very useful. Most importantly, the patient is much more likely to exercise if the caregiver / spouse does it with him/her. In general a structured, repetitive schedule is best.  General Health: Any diseases which effect your body will effect your brain such as a pneumonia, urinary infection, blood clot, heart attack or stroke. Keep contact with your primary care doctor for regular follow ups.  Sleep. A good nights sleep is healthy for the brain. Seven hours is recommended. If you have insomnia or poor sleep habits we can give you some instructions. If you have sleep apnea wear your mask.  Diet: Eating a heart healthy diet is also a good idea; fish and poultry instead of red meat, nuts (mostly non-peanuts), vegetables, fruits, olive oil or canola oil (instead of butter), minimal salt (use other spices to flavor foods), whole grain rice, bread, cereal and pasta and wine in moderation.Research is now showing that the MIND diet, which is a combination of The Mediterranean diet and the DASH diet, is beneficial for cognitive processing and longevity. Information about this diet can be found in The MIND Diet, a book by Alonna Minium, MS, RDN, and online at [employer]  Finances, Power of [address] and Advance Directives: You should consider putting legal safeguards in place with regard to financial and medical decision making. While the spouse always has power of attorney for medical and financial issues in the absence of any form, you  should consider what you want in case the spouse / caregiver is no  longer around or capable of making decisions.     No orders of the defined types were placed in this encounter.   Meds ordered this encounter  Medications   donepezil (ARICEPT) 10 MG tablet    Sig: Take 1 tablet (10 mg total) by mouth at bedtime.    Dispense:  30 tablet    Refill:  11    Return if symptoms worsen or fail to improve.  I have spent a total of 50 minutes dedicated to this patient today, preparing to see patient, performing a medically appropriate examination and evaluation, ordering tests and/or medications and procedures, and counseling and educating the patient/family/caregiver; independently interpreting result and communicating results to the family/patient/caregiver; and documenting clinical information in the electronic medical record.  Windell Norfolk, MD 11/08/2023, 3:42 PM  [facility] [address] [place], [place] [address] [phone]

## 2023-11-15 DIAGNOSIS — Z823 Family history of stroke: Secondary | ICD-10-CM | POA: Diagnosis not present

## 2023-11-15 DIAGNOSIS — F0280 Dementia in other diseases classified elsewhere without behavioral disturbance: Secondary | ICD-10-CM | POA: Diagnosis not present

## 2023-11-15 DIAGNOSIS — Z818 Family history of other mental and behavioral disorders: Secondary | ICD-10-CM | POA: Diagnosis not present

## 2023-11-15 DIAGNOSIS — E785 Hyperlipidemia, unspecified: Secondary | ICD-10-CM | POA: Diagnosis not present

## 2023-11-15 DIAGNOSIS — R030 Elevated blood-pressure reading, without diagnosis of hypertension: Secondary | ICD-10-CM | POA: Diagnosis not present

## 2023-11-15 DIAGNOSIS — Z833 Family history of diabetes mellitus: Secondary | ICD-10-CM | POA: Diagnosis not present

## 2023-11-15 DIAGNOSIS — Z809 Family history of malignant neoplasm, unspecified: Secondary | ICD-10-CM | POA: Diagnosis not present

## 2023-11-15 DIAGNOSIS — G309 Alzheimer's disease, unspecified: Secondary | ICD-10-CM | POA: Diagnosis not present

## 2023-12-08 DIAGNOSIS — H43811 Vitreous degeneration, right eye: Secondary | ICD-10-CM | POA: Diagnosis not present

## 2024-01-01 DIAGNOSIS — H43811 Vitreous degeneration, right eye: Secondary | ICD-10-CM | POA: Diagnosis not present

## 2024-01-31 ENCOUNTER — Ambulatory Visit: Payer: Medicare PPO | Admitting: Psychology

## 2024-01-31 ENCOUNTER — Encounter: Payer: Medicare PPO | Admitting: Psychology

## 2024-03-01 ENCOUNTER — Other Ambulatory Visit: Payer: Self-pay | Admitting: Neurology

## 2024-04-24 ENCOUNTER — Ambulatory Visit: Payer: Medicare PPO | Admitting: Psychology

## 2024-05-06 ENCOUNTER — Encounter: Attending: Psychology | Admitting: Psychology

## 2024-05-06 DIAGNOSIS — F02B0 Dementia in other diseases classified elsewhere, moderate, without behavioral disturbance, psychotic disturbance, mood disturbance, and anxiety: Secondary | ICD-10-CM | POA: Insufficient documentation

## 2024-05-06 DIAGNOSIS — G309 Alzheimer's disease, unspecified: Secondary | ICD-10-CM | POA: Diagnosis not present

## 2024-05-06 DIAGNOSIS — R413 Other amnesia: Secondary | ICD-10-CM | POA: Diagnosis not present

## 2024-05-07 ENCOUNTER — Encounter: Payer: Self-pay | Admitting: Psychology

## 2024-05-07 NOTE — Progress Notes (Signed)
 Neuropsychological Evaluation   Patient:  Sharon Elliott   DOB: [birth year]  MR Number: [id number]  Location: [facility] [place] [facility] [address] [place], [address] [place] [address] Dept: [phone]  Start: 3 PM End: For pain  Provider/Observer:     Marrion Sjogren PsyD  Chief Complaint:      Chief Complaint  Patient presents with   Memory Loss   05/06/2024 3 PM-4 PM: Today I provided feedback regarding the results of the neuropsychological evaluation that had been completed in 2024 but this is the first time I am was able to meet with the patient and her daughter.  We went over the results with diagnostic considerations.  The patient is continuing with follow-up through neurology and Dr. Samara Crest.  The patient had initially been started on Aricept , which was discontinued at that some point due to some family member concerns but has now been restarted.  The patient had no particular side effects and family noted some improvements in day-to-day cognition with this medicine.  The patient has patterns consistent with late onset major neurocognitive disorder due to Alzheimer's type pathology.  I have included a copy for the reason for service and summary of the evaluation below for convenience and the patient's complete neuropsychological evaluation can be found in her EMR dated 09/07/2023.  Today we went over coping strategies and discussed expectations going forward for the patient and family.   Reason For Service:     Sharon Elliott is a 78 year old female referred for neuropsychological evaluation by her treating neurologist Cassandra Cleveland, MD after being referred for neurological workup by her PCP Benedetto Brady, MD.  The reason for this referral had to do with progressing and increasing issues primarily with memory and no other significant changes other cognitive domains being reported.  The patient describes  the maintenance of expressive and receptive language, motor functions etc.  The patient does appear to be having more difficulties with comprehension, recall of information and reported some changes in attention/concentration.  While the patient has been able to maintain ADLs or IADLs the patient's daughter reports that these have been primarily completed by one of her daughters and the daughters are doing almost everything with cleaning, cooking and shopping and taking care of household chores for both the patient and her husband.  The patient's husband is also having medical issues.  The patient is having difficulty with both episodic and semantic memory functions and is displaying mild to moderate cognitive decline that is progressing in nature.  Patient has a family history of her mother being diagnosed with Alzheimer's with symptoms developing around 97 but unclear the depth of diagnostic workup for her mother.  Patient denies any tremors or significant change in gait or motor function and has returned to her lifelong activities with ballroom dancing and being as physically active as she can.  Patient's mood state is described as being positive overall.   The patient and her daughter were present for the clinical interview today in my office.  Much of the information was provided by her daughter but confirmed by the patient.  The patient first noticed changes after the patient's mother passed away from Alzheimer's complications and degenerative process.  This happened around 2017.  The patient noted that she felt like her memory was not as sharp as it had been and the patient has been very active in ballroom dancing, social interactions and walking with  family members etc. patient's daughter reports that symptoms have slowly progressed.  Around the time of COVID with the social isolation etc. and the patient not being able to engage in the social activity, she had been having cognitive and memory changes that  appeared to progress and were noticed by other family members.  The patient's daughter really noted changes around November 2020.  There was a sense that the patient's joy in life was removed and sadness and loss were evident.  Patient's daughter notes that even with the patient's last visit with her PCP Dr. Ivey Marlin, that he noticed more sadness and depressed mood from what he was used to seeing with the patient.  The patient is described as becoming more reserved and less engaging.  The patient's husband has been through his on stress and trauma with a sibling being murdered around 5 years ago that have really impacted him as well as his own medical status, which the daughter feels like is exacerbating the patient's frustration.  However, the family has worked getting the patient back into dance and more involved, which appears to be helping.  Diet is described as quite good and the family is particularly focused on a primarily vegan diet with lots of healthy fats, vegetables fruits as well as nuts and seeds.  Patient has been encouraged and engaged in mental exercises and being very active and engaged in physical exercises as well.   Dr. Camara has started the patient on a low-dose of Aricept .  The patient's family was initially quite concerned about possible side effects but family members feel that it has been of significant benefit to her functioning.  The patient did end up starting the Aricept  and the patient's daughter reports that it seems like it has improved the patient's day-to-day functioning and the patient does not appear to be changing as progressively as previously.  There are no difficulties or changes in sleep noticed but there has been a reduction in appetite to some degree.  Family is making a concerted effort to make sure that she is getting good nutrition and excellent diet as well as continuing to get enough water every day.   The patient's mother developed cognitive change in her mid to  late 57s.  The patient started noticing her own changes around 2017 but the family became well aware and noting problems in November 2020.  Because of COVID restriction in place the patient's daughter was doing all of the outside day-to-day activities with a significant change in activities for the patient.  The patient and her daughter deny any changes in motor functions and denies any auditory or visual hallucinations and there were no descriptions of tremors and I noted no tremors today.   Impression/Diagnosis:   The results of the current neuropsychological evaluation are consistent with subjective reports and even represent greater memory deficits than her being reported are perceived.  The patient's objective neuropsychological assessment clearly indicate a preserved capacity to encode new information and attend to it and process the information in a timely manner.  However, when asked to process and store/organize that information there are severe and significant deficits for both auditory and visual memory domains.  Even with the limited information that is initially stored and organized it is quickly lost and the patient is showing profound deficits retrieving information after period of delay.  The patient is showing significant deficits in fluid reasoning and problem-solving ability and mild global losses of cognition with generally well-preserved information processing speed,  expressive language capacity, visual-spatial and visual constructional capacity.  The patient does show some abnormalities on her ATN blood work consistent with an Alzheimer's type pathology if there were cognitive deficits present.  In this case there are significant memory and learning deficits with generally well-preserved expressive language capacity and significant deficits in actively processing information and fluid reasoning capacity.  As far as diagnostic considerations, the patient does present with symptoms consistent  with an Alzheimer's type pathology that is slowly progressed over the past 5 or 6 years after becoming clearly noticeable by 2020 if not earlier.  This slow progression would be consistent with a late onset pattern.  Patient's family have reported some improvements in functioning with the addition of Aricept .  The patient also is showing some changes in mood and behavioral patterns more consistent with depressed mood rather than change in fundamental personality styles.  As far as treatment recommendations, I encouraged the patient and family to continue to work with her neurologist as he is providing good care and on the right track as far as treatment recommendations and strategies.  The patient may benefit from the addition of an SSRI medication to help with mood although some of these mood changes may likely be also part of her late onset Alzheimer's type pathology.  I will leave it up to the patient's neurologist about whether the patient would be an appropriate candidate for Lecanemab or other neurological interventions.  The patient's family appear to be approaching this in the very efficient and appropriate manner providing help to the patient as far as her IADLs and continuing to engage the patient in physical activity having the patient return to her dance activities which she had done throughout her life.  The patient is also being monitored as far as her diet and is maintaining a very good diet and has good support from family.  I will sit down with the patient and family and go over the results of the current neuropsychological evaluation and talk about specific recommendations for them beyond continuing to work with her treating neurologist.   Diagnosis:    Moderate major neurocognitive disorder due to Alzheimer disease without behavioral disturbance (HCC)  Memory loss   _____________________ Chapman Commodore, Psy.D. Clinical Neuropsychologist

## 2024-05-13 DIAGNOSIS — M8588 Other specified disorders of bone density and structure, other site: Secondary | ICD-10-CM | POA: Diagnosis not present

## 2024-05-13 DIAGNOSIS — M8580 Other specified disorders of bone density and structure, unspecified site: Secondary | ICD-10-CM | POA: Diagnosis not present

## 2024-05-13 DIAGNOSIS — G309 Alzheimer's disease, unspecified: Secondary | ICD-10-CM | POA: Diagnosis not present

## 2024-05-13 DIAGNOSIS — N1831 Chronic kidney disease, stage 3a: Secondary | ICD-10-CM | POA: Diagnosis not present

## 2024-05-13 DIAGNOSIS — Z1211 Encounter for screening for malignant neoplasm of colon: Secondary | ICD-10-CM | POA: Diagnosis not present

## 2024-05-13 DIAGNOSIS — Z23 Encounter for immunization: Secondary | ICD-10-CM | POA: Diagnosis not present

## 2024-05-13 DIAGNOSIS — Z1239 Encounter for other screening for malignant neoplasm of breast: Secondary | ICD-10-CM | POA: Diagnosis not present

## 2024-05-13 DIAGNOSIS — E785 Hyperlipidemia, unspecified: Secondary | ICD-10-CM | POA: Diagnosis not present

## 2024-05-13 DIAGNOSIS — Z0000 Encounter for general adult medical examination without abnormal findings: Secondary | ICD-10-CM | POA: Diagnosis not present

## 2024-06-18 ENCOUNTER — Ambulatory Visit (HOSPITAL_COMMUNITY)
Admission: EM | Admit: 2024-06-18 | Discharge: 2024-06-18 | Disposition: A | Discharge status: Home / Self Care | Attending: Internal Medicine | Admitting: Internal Medicine

## 2024-06-18 ENCOUNTER — Encounter (HOSPITAL_COMMUNITY): Payer: Self-pay

## 2024-06-18 ENCOUNTER — Ambulatory Visit (INDEPENDENT_AMBULATORY_CARE_PROVIDER_SITE_OTHER)

## 2024-06-18 DIAGNOSIS — R059 Cough, unspecified: Secondary | ICD-10-CM | POA: Diagnosis not present

## 2024-06-18 DIAGNOSIS — R0989 Other specified symptoms and signs involving the circulatory and respiratory systems: Secondary | ICD-10-CM | POA: Diagnosis not present

## 2024-06-18 DIAGNOSIS — R051 Acute cough: Secondary | ICD-10-CM

## 2024-06-18 DIAGNOSIS — U071 COVID-19: Secondary | ICD-10-CM | POA: Diagnosis not present

## 2024-06-18 HISTORY — DX: Unspecified dementia, unspecified severity, without behavioral disturbance, psychotic disturbance, mood disturbance, and anxiety: F03.90

## 2024-06-18 MED ORDER — BENZONATATE 100 MG PO CAPS: 100.0000 mg | ORAL_CAPSULE | Freq: Three times a day (TID) | ORAL | 0 refills | Status: DC

## 2024-06-18 NOTE — ED Triage Notes (Signed)
 Patient's daughter reports that the patient has had a non productive cough and nasal congestion x  4 days. Patient tested Covid positive yesterday. Patient was around a person who had symptoms a week ago and tested positive yesterday as well.  Patient's daughter states she is here for possible Paxlovid

## 2024-06-18 NOTE — ED Provider Notes (Signed)
 [facility]    CSN: [id number] Arrival date & time: 06/18/24  1247      History   Chief Complaint Chief Complaint  Patient presents with   Covid Positive   Nasal Congestion   Cough    HPI Sharon Elliott is a 78 y.o. female.   78 year old female who is brought to urgent care by her daughter secondary to positive COVID test at home.  The daughter provides majority of the history as the patient does have some mild dementia.  The daughter reports that a week ago the patient was with a friend of hers.  They then found out that this friend tested positive for COVID in the last few days.  The patient has had cough and nasal congestion therefore they tested her for COVID.  This was positive.  Her symptoms started on Friday evening but at that time they did not think that this was secondary to COVID.  They tried vitamin C and over-the-counter cough drops.  On Saturday she continued to have coughing.  Her symptoms overall are relatively mild.  They deny any fevers, chills, shortness of breath, chest pain, nausea, vomiting.  They would like to consider Paxlovid.     Cough Associated symptoms: no chest pain, no chills, no ear pain, no fever, no rash, no shortness of breath and no sore throat     Past Medical History:  Diagnosis Date   Anemia    hx   Dementia (HCC)    GERD (gastroesophageal reflux disease)     Patient Active Problem List   Diagnosis Date Noted   GERD 05/13/2009   CHEST PAIN, ATYPICAL 04/10/2009    Past Surgical History:  Procedure Laterality Date   TOOTH EXTRACTION      OB History   No obstetric history on file.      Home Medications    Prior to Admission medications   Medication Sig Start Date End Date Taking? Authorizing Provider  benzonatate  (TESSALON ) 100 MG capsule Take 1 capsule (100 mg total) by mouth every 8 (eight) hours. 06/18/24  Yes Docia Klar A, PA-C  Cholecalciferol (VITAMIN D-3 PO) Take 1 tablet by mouth daily.     [provider]  Cyanocobalamin  (VITAMIN B-12 PO) Take 1 capsule by mouth daily.    [provider]  donepezil  (ARICEPT ) 10 MG tablet Take 1 tablet (10 mg total) by mouth at bedtime. 11/08/23 11/02/24  Gregg Lek, MD  NON FORMULARY Take 3 tablets by mouth daily. cocoa via supplement for memory    [provider]    Family History Family History  Problem Relation Age of Onset   Breast cancer Other        1st degree relative <50   Stroke Other        F 1st degree relative <60   Hypertension Other    Diabetes Other        1st degree relative    Social History Social History   Tobacco Use   Smoking status: Never  Vaping Use   Vaping status: Never Used  Substance Use Topics   Alcohol use: No   Drug use: Not Currently     Allergies   Patient has no known allergies.   Review of Systems Review of Systems  Constitutional:  Negative for chills and fever.  HENT:  Positive for congestion. Negative for ear pain and sore throat.   Eyes:  Negative for pain and visual disturbance.  Respiratory:  Positive for  cough. Negative for shortness of breath.   Cardiovascular:  Negative for chest pain and palpitations.  Gastrointestinal:  Negative for abdominal pain and vomiting.  Genitourinary:  Negative for dysuria and hematuria.  Musculoskeletal:  Negative for arthralgias and back pain.  Skin:  Negative for color change and rash.  Neurological:  Negative for seizures and syncope.  All other systems reviewed and are negative.    Physical Exam Triage Vital Signs ED Triage Vitals [06/18/24 1320]  Encounter Vitals Group     BP 117/62     Girls Systolic BP Percentile      Girls Diastolic BP Percentile      Boys Systolic BP Percentile      Boys Diastolic BP Percentile      Pulse Rate 73     Resp 16     Temp 98.4 F (36.9 C)     Temp Source Oral     SpO2 98 %     Weight      Height      Head Circumference      Peak Flow      Pain Score 0     Pain  Loc      Pain Education      Exclude from Growth Chart    No data found.  Updated Vital Signs BP 117/62 (BP Location: Left Arm)   Pulse 73   Temp 98.4 F (36.9 C) (Oral)   Resp 16   SpO2 98%   Visual Acuity Right Eye Distance:   Left Eye Distance:   Bilateral Distance:    Right Eye Near:   Left Eye Near:    Bilateral Near:     Physical Exam Vitals and nursing note reviewed.  Constitutional:      General: She is not in acute distress.    Appearance: She is well-developed.  HENT:     Head: Normocephalic and atraumatic.     Right Ear: Tympanic membrane normal.     Left Ear: Tympanic membrane normal.     Nose: Congestion present.     Mouth/Throat:     Mouth: Mucous membranes are moist.  Eyes:     Conjunctiva/sclera: Conjunctivae normal.  Cardiovascular:     Rate and Rhythm: Normal rate and regular rhythm.     Heart sounds: No murmur heard. Pulmonary:     Effort: Pulmonary effort is normal. No tachypnea or respiratory distress.     Breath sounds: Examination of the left-lower field reveals rhonchi. Rhonchi present. No decreased breath sounds or wheezing.  Abdominal:     Palpations: Abdomen is soft.     Tenderness: There is no abdominal tenderness.  Musculoskeletal:        General: No swelling.     Cervical back: Neck supple.  Skin:    General: Skin is warm and dry.     Capillary Refill: Capillary refill takes less than 2 seconds.  Neurological:     Mental Status: She is alert.  Psychiatric:        Mood and Affect: Mood normal.      UC Treatments / Results  Labs (all labs ordered are listed, but only abnormal results are displayed) Labs Reviewed - No data to display  EKG   Radiology DG Chest 2 View Result Date: 06/18/2024 CLINICAL DATA:  Four day history of nonproductive cough and nasal congestion. COVID positive. EXAM: CHEST - 2 VIEW COMPARISON:  Chest radiograph dated 03/28/2009 FINDINGS: Normal lung volumes. No focal consolidations. No pleural  effusion or pneumothorax. The heart size and mediastinal contours are within normal limits. No acute osseous abnormality. IMPRESSION: No focal consolidations. Electronically Signed   By: Limin  Xu M.D.   On: 06/18/2024 14:38    Procedures Procedures (including critical care time)  Medications Ordered in UC Medications - No data to display  Initial Impression / Assessment and Plan / UC Course  I have reviewed the triage vital signs and the nursing notes.  Pertinent labs & imaging results that were available during my care of the patient were reviewed by me and considered in my medical decision making (see chart for details).     Acute cough - Plan: DG Chest 2 View, DG Chest 2 View  Lab test positive for detection of COVID-19 virus - Plan: DG Chest 2 View, DG Chest 2 View   Positive COVID test at home.  Chest x-ray done today.  This is negative for any acute or infectious process.  Overall symptoms are very mild and as the symptoms started on Friday we are now at 4 to 5 days into the course.  Do not believe that Paxlovid will be of much benefit given the mild nature of her symptoms as well as the duration she has had symptoms.  May continue over-the-counter medication for cough and congestion.  Will call in the following medication to help with cough: Benzonatate  (tessalon ) 100 mg every 8 hours as needed for cough.   Make sure to stay hydrated, try to drink plenty of water during the day If symptoms worsen or fail to improve, then recommend returning to urgent care for further evaluation.  Final Clinical Impressions(s) / UC Diagnoses   Final diagnoses:  Acute cough  Lab test positive for detection of COVID-19 virus     Discharge Instructions      Positive COVID test at home.  Chest x-ray done today.  This is negative for any acute or infectious process.  Overall symptoms are very mild and as the symptoms started on Friday we are now at 4 to 5 days into the course.  Do not believe  that Paxlovid will be of much benefit given the mild nature of her symptoms as well as the duration she has had symptoms.  May continue over-the-counter medication for cough and congestion.  Will call in the following medication to help with cough: Benzonatate  (tessalon ) 100 mg every 8 hours as needed for cough.   Make sure to stay hydrated, try to drink plenty of water during the day If symptoms worsen or fail to improve, then recommend returning to urgent care for further evaluation.     ED Prescriptions     Medication Sig Dispense Auth. Provider   benzonatate  (TESSALON ) 100 MG capsule Take 1 capsule (100 mg total) by mouth every 8 (eight) hours. 21 capsule Teresa Almarie LABOR, [place]      PDMP not reviewed this encounter.   Teresa Almarie LABOR, PA-C 06/18/24 1449

## 2024-06-18 NOTE — Discharge Instructions (Addendum)
 Positive COVID test at home.  Chest x-ray done today.  This is negative for any acute or infectious process.  Overall symptoms are very mild and as the symptoms started on Friday we are now at 4 to 5 days into the course.  Do not believe that Paxlovid will be of much benefit given the mild nature of her symptoms as well as the duration she has had symptoms.  May continue over-the-counter medication for cough and congestion.  Will call in the following medication to help with cough: Benzonatate  (tessalon ) 100 mg every 8 hours as needed for cough.   Make sure to stay hydrated, try to drink plenty of water during the day If symptoms worsen or fail to improve, then recommend returning to urgent care for further evaluation.

## 2024-07-07 ENCOUNTER — Other Ambulatory Visit: Payer: Self-pay | Admitting: Neurology

## 2024-08-12 ENCOUNTER — Encounter (HOSPITAL_COMMUNITY): Payer: Self-pay | Admitting: Emergency Medicine

## 2024-08-12 ENCOUNTER — Ambulatory Visit (HOSPITAL_COMMUNITY): Admission: EM | Admit: 2024-08-12 | Discharge: 2024-08-12 | Disposition: A | Discharge status: Home / Self Care

## 2024-08-12 ENCOUNTER — Other Ambulatory Visit: Payer: Self-pay

## 2024-08-12 DIAGNOSIS — J302 Other seasonal allergic rhinitis: Secondary | ICD-10-CM

## 2024-08-12 DIAGNOSIS — R051 Acute cough: Secondary | ICD-10-CM | POA: Diagnosis not present

## 2024-08-12 LAB — POC COVID19/FLU A&B COMBO
Covid Antigen, POC: NEGATIVE
Influenza A Antigen, POC: NEGATIVE
Influenza B Antigen, POC: NEGATIVE

## 2024-08-12 MED ORDER — BENZONATATE 100 MG PO CAPS: 100.0000 mg | ORAL_CAPSULE | Freq: Three times a day (TID) | ORAL | 0 refills | Status: AC

## 2024-08-12 NOTE — ED Triage Notes (Signed)
 Cough for 2 days, cough is worse in the evening.  Cough is non-productive.  No other symptoms or complaints.  Daughter with patient and says they just want to make sure patient is ok.  Patient's spouse is immunocompromised and patient and family member want to be very cautious  Has not tried any medications

## 2024-08-12 NOTE — ED Provider Notes (Signed)
 [facility]    CSN: [id number] Arrival date & time: 08/12/24  1613      History   Chief Complaint Chief Complaint  Patient presents with   Cough    HPI Sharon Elliott is a 78 y.o. female.   Patient presents today accompanied by her daughter who provides majority of history.  Reports a 2-day history of URI symptoms including rhinorrhea and a cough.  Denies any fever, chest pain, shortness of breath, nausea, vomiting.  Denies any known sick contacts.  She has had COVID with last episode several months ago.  She has had COVID-19 vaccines.  She is eating and drinking normally.  She has not taken any over-the-counter medication for symptom management.  Denies any recent antibiotics or steroids.  Her daughter just wanted make sure that everything was okay and get her checked out.    Past Medical History:  Diagnosis Date   Anemia    hx   Dementia (HCC)    GERD (gastroesophageal reflux disease)     Patient Active Problem List   Diagnosis Date Noted   GERD 05/13/2009   CHEST PAIN, ATYPICAL 04/10/2009    Past Surgical History:  Procedure Laterality Date   TOOTH EXTRACTION      OB History   No obstetric history on file.      Home Medications    Prior to Admission medications   Medication Sig Start Date End Date Taking? Authorizing Provider  benzonatate  (TESSALON ) 100 MG capsule Take 1 capsule (100 mg total) by mouth every 8 (eight) hours. 08/12/24  Yes Natoya Viscomi K, PA-C  magnesium 30 MG tablet Take 30 mg by mouth 2 (two) times daily.   Yes [provider]  Cholecalciferol (VITAMIN D-3 PO) Take 1 tablet by mouth daily.    [provider]  Cyanocobalamin  (VITAMIN B-12 PO) Take 1 capsule by mouth daily.    [provider]  donepezil  (ARICEPT ) 10 MG tablet TAKE 1 TABLET(10 MG) BY MOUTH AT BEDTIME 07/08/24   Gregg Lek, MD  NON FORMULARY Take 3 tablets by mouth daily. cocoa via supplement for memory    [provider]     Family History Family History  Problem Relation Age of Onset   Breast cancer Other        1st degree relative <50   Stroke Other        F 1st degree relative <60   Hypertension Other    Diabetes Other        1st degree relative    Social History Social History   Tobacco Use   Smoking status: Never  Vaping Use   Vaping status: Never Used  Substance Use Topics   Alcohol use: No   Drug use: Not Currently     Allergies   Patient has no known allergies.   Review of Systems Review of Systems  Constitutional:  Negative for activity change, appetite change, fatigue and fever.  HENT:  Positive for rhinorrhea. Negative for congestion, sinus pressure, sneezing and sore throat.   Respiratory:  Positive for cough. Negative for shortness of breath.   Cardiovascular:  Negative for chest pain.  Gastrointestinal:  Negative for diarrhea, nausea and vomiting.  Neurological:  Negative for dizziness, light-headedness and headaches.     Physical Exam Triage Vital Signs ED Triage Vitals  Encounter Vitals Group     BP 08/12/24 1829 (!) 151/79     Girls Systolic BP Percentile --      Girls  Diastolic BP Percentile --      Boys Systolic BP Percentile --      Boys Diastolic BP Percentile --      Pulse Rate 08/12/24 1829 75     Resp 08/12/24 1829 18     Temp 08/12/24 1829 98.2 F (36.8 C)     Temp Source 08/12/24 1829 Oral     SpO2 08/12/24 1829 98 %     Weight --      Height --      Head Circumference --      Peak Flow --      Pain Score 08/12/24 1825 0     Pain Loc --      Pain Education --      Exclude from Growth Chart --    No data found.  Updated Vital Signs BP (!) 151/79 (BP Location: Left Arm)   Pulse 75   Temp 98.2 F (36.8 C) (Oral)   Resp 18   SpO2 98%   Visual Acuity Right Eye Distance:   Left Eye Distance:   Bilateral Distance:    Right Eye Near:   Left Eye Near:    Bilateral Near:     Physical Exam Vitals reviewed.  Constitutional:       General: She is awake. She is not in acute distress.    Appearance: Normal appearance. She is well-developed. She is not ill-appearing.     Comments: Very pleasant female appears stated age in no acute distress sitting comfortable in exam room  HENT:     Head: Normocephalic and atraumatic.     Right Ear: Ear canal and external ear normal. A middle ear effusion is present. Tympanic membrane is not erythematous or bulging.     Left Ear: Ear canal and external ear normal. A middle ear effusion is present. Tympanic membrane is not erythematous or bulging.     Nose:     Right Sinus: No maxillary sinus tenderness or frontal sinus tenderness.     Left Sinus: No maxillary sinus tenderness or frontal sinus tenderness.     Mouth/Throat:     Pharynx: Uvula midline. No oropharyngeal exudate or posterior oropharyngeal erythema.  Cardiovascular:     Rate and Rhythm: Normal rate and regular rhythm.     Heart sounds: Normal heart sounds, S1 normal and S2 normal. No murmur heard. Pulmonary:     Effort: Pulmonary effort is normal.     Breath sounds: Normal breath sounds. No wheezing, rhonchi or rales.     Comments: Clear to auscultation bilaterally Psychiatric:        Behavior: Behavior is cooperative.      UC Treatments / Results  Labs (all labs ordered are listed, but only abnormal results are displayed) Labs Reviewed  POC COVID19/FLU A&B COMBO    EKG   Radiology No results found.  Procedures Procedures (including critical care time)  Medications Ordered in UC Medications - No data to display  Initial Impression / Assessment and Plan / UC Course  I have reviewed the triage vital signs and the nursing notes.  Pertinent labs & imaging results that were available during my care of the patient were reviewed by me and considered in my medical decision making (see chart for details).     Patient is well-appearing, afebrile, nontoxic, nontachycardic.  No evidence of acute infection on  physical exam that warranted initiation of antibiotics.  Chest x-ray was deferred as she had no adventitious lung sounds oxygen saturation was  98%.  COVID and flu testing was negative.  We discussed that she likely has either a mild viral illness or seasonal allergies.  Recommended conservative treat measures including nasal saline and sinus rinses as well as particular nasal spray.  She was given Tessalon  to help with cough symptoms.  We discussed that if her symptoms not improving within a week she should return for reevaluation.  If anything worsen she needs to be seen immediately.  Strict return precautions given.  All questions answered to caregiver satisfaction.  Final Clinical Impressions(s) / UC Diagnoses   Final diagnoses:  Acute cough  Seasonal allergies     Discharge Instructions      She does negative for COVID and flu.  I suspect she has either a mild viral illness or allergies contributing to her symptoms.  I recommended nasal saline and sinus rinses.  I have called in Tessalon  to help with cough symptoms particularly at night.  If anything worsens and she has high fever, worsening cough, shortness of breath, chest pain, nausea/vomiting interfere with oral intake she needs to be seen immediately.  If symptoms have not resolved within a week please return for reevaluation.     ED Prescriptions     Medication Sig Dispense Auth. Provider   benzonatate  (TESSALON ) 100 MG capsule Take 1 capsule (100 mg total) by mouth every 8 (eight) hours. 21 capsule TRUE Shackleford K, PA-C      PDMP not reviewed this encounter.   Sherrell Rocky POUR, PA-C 08/12/24 2009

## 2024-08-12 NOTE — Discharge Instructions (Signed)
 She does negative for COVID and flu.  I suspect she has either a mild viral illness or allergies contributing to her symptoms.  I recommended nasal saline and sinus rinses.  I have called in Tessalon  to help with cough symptoms particularly at night.  If anything worsens and she has high fever, worsening cough, shortness of breath, chest pain, nausea/vomiting interfere with oral intake she needs to be seen immediately.  If symptoms have not resolved within a week please return for reevaluation.

## 2024-09-07 ENCOUNTER — Other Ambulatory Visit: Payer: Self-pay | Admitting: Neurology

## 2024-09-13 ENCOUNTER — Other Ambulatory Visit (HOSPITAL_BASED_OUTPATIENT_CLINIC_OR_DEPARTMENT_OTHER): Payer: Self-pay

## 2024-09-13 MED ORDER — FLUZONE HIGH-DOSE 0.5 ML IM SUSY
0.5000 mL | PREFILLED_SYRINGE | Freq: Once | INTRAMUSCULAR | 0 refills | Status: AC
  Filled 2024-09-13: qty 0.5, 1d supply, fill #0

## 2024-09-13 MED ORDER — COMIRNATY 30 MCG/0.3ML IM SUSY
0.3000 mL | PREFILLED_SYRINGE | Freq: Once | INTRAMUSCULAR | 0 refills | Status: AC
  Filled 2024-09-13: qty 0.3, 1d supply, fill #0

## 2024-10-03 ENCOUNTER — Encounter: Payer: Self-pay | Admitting: Neurology

## 2024-10-03 ENCOUNTER — Ambulatory Visit: Admitting: Neurology

## 2024-10-03 VITALS — BP 115/74 | HR 75 | Ht 66.0 in | Wt 130.2 lb

## 2024-10-03 DIAGNOSIS — G301 Alzheimer's disease with late onset: Secondary | ICD-10-CM

## 2024-10-03 DIAGNOSIS — F02B0 Dementia in other diseases classified elsewhere, moderate, without behavioral disturbance, psychotic disturbance, mood disturbance, and anxiety: Secondary | ICD-10-CM

## 2024-10-03 MED ORDER — MEMANTINE HCL 10 MG PO TABS: 10.0000 mg | ORAL_TABLET | Freq: Two times a day (BID) | ORAL | 3 refills | Status: DC

## 2024-10-03 MED ORDER — MEMANTINE HCL 28 X 5 MG & 21 X 10 MG PO TABS: ORAL_TABLET | ORAL | 0 refills | Status: DC

## 2024-10-03 NOTE — Progress Notes (Signed)
 [facility]  PATIENT: Sharon Elliott DOB: Apr 21, 1946  REQUESTING CLINICIAN: Leonel Cole, MD HISTORY FROM: Patient and daughter  REASON FOR VISIT: Memory loss    HISTORICAL  CHIEF COMPLAINT:  Chief Complaint  Patient presents with   Follow-up    Pt in room 13. Daughter in room. Here for worsening memory loss. MOCA:17   INTERVAL HISTORY 10/03/2024 Patient presents today for follow-up, she is accompanied by her daughter Niles.  Last visit was a year ago, since then, daughter tells me that her memory is getting worse.  They noted the decline around spring time.  Patient is more forgetful, needs constant reminders, has difficulty with activities of daily living.  Her daughter is her primary caregiver.  She does exercise, walks, her sleep is good, diet is good and daily denies any agitation.  Daughter has noted a little bit of anxiety.  She is still able to bathe and dress herself.   INTERVAL HISTORY 11/08/2023 Patient presents today for follow-up, she is accompanied by her daughter.  Last visit was a year ago, and since then she has followed with Dr. Cecillia, completed neuropsychological testing and was diagnosed with major cognitive impairment likely due to Alzheimer disease.  She was prescribed Aricept  5 mg nightly but daughter is telling me that patient has not been taking the medication for the past month. She was doing well when taking the Aricept . They tell me things at home are the same, and they are stable. Patient live with one of her daughters who dos all the cooking, cleaning, drive them around and handle the bills.   HISTORY OF PRESENT ILLNESS:  This is a 78 year old woman with history of hyperlipidemia who is presenting with memory decline.  Patient feels like her memory is not what it used to be.  She also reports since COVID pandemic she has not been as active as before. She feels stuck. 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  During the pandemic she was stuck in the house and since then they have noted that her memory has changed.  She has difficulty sometimes recalling recent conversation, she also have word finding difficulty  Daughter reports that she has to remind her multiple times, for instance for today's appointment she did remind her again.  At home, daughter is doing almost everything with cleaning, cooking, and shopping, and mowing the lawn.  They report that their father/husband is also sick and has decreased mobility  Patient is able to care for herself, they do not report any difficulty with using items around the house.  Daughter has taken over some of her bills.   TBI:   No past history of TBI Stroke:   no past history of stroke Seizures:   no past history of seizures Sleep:   no history of sleep apnea.  Mood:  patient denies anxiety and depression Family history of Dementia: Mother in her 21s   Functional status: Dependent in most ADLs.  Patient lives with husband and daughter. Cooking: daughter  Cleaning: daughter  Shopping: daughter  Bathing: patient, no help needed  Toileting: patient, no help needed Driving: yes, no accident  Bills: Husband, daughter has also been helping paying patient personal bills   Ever left the stove on by accident?: Denies  Forget how to use items around the house?: No  Getting lost going to familiar places?: Denies  Forgetting loved ones names?: Denies  Word finding difficulty? Yes Sleep: Good  OTHER MEDICAL CONDITIONS: Hyperlipidemia    REVIEW OF SYSTEMS: Full 14 system review of systems performed and negative with exception of: As noted in the HPI   ALLERGIES: No Known Allergies  HOME MEDICATIONS: Outpatient Medications Prior to Visit  Medication Sig Dispense Refill   Ascorbic Acid 500 MG CAPS as directed Orally     Cholecalciferol (VITAMIN D-3 PO) Take 1 tablet by mouth daily.     Cyanocobalamin  (VITAMIN B-12 PO) Take 1 capsule  by mouth daily.     donepezil  (ARICEPT ) 10 MG tablet TAKE 1 TABLET(10 MG) BY MOUTH AT BEDTIME 30 tablet 11   Omega-3 Fatty Acids (FISH OIL OMEGA-3 PO) Take by mouth.     benzonatate  (TESSALON ) 100 MG capsule Take 1 capsule (100 mg total) by mouth every 8 (eight) hours. (Patient not taking: Reported on 10/03/2024) 21 capsule 0   magnesium 30 MG tablet Take 30 mg by mouth 2 (two) times daily. (Patient not taking: Reported on 10/03/2024)     NON FORMULARY Take 3 tablets by mouth daily. cocoa via supplement for memory (Patient not taking: Reported on 10/03/2024)     No facility-administered medications prior to visit.    PAST MEDICAL HISTORY: Past Medical History:  Diagnosis Date   Anemia    hx   Dementia (HCC)    GERD (gastroesophageal reflux disease)     PAST SURGICAL HISTORY: Past Surgical History:  Procedure Laterality Date   TOOTH EXTRACTION      FAMILY HISTORY: Family History  Problem Relation Age of Onset   Breast cancer Other        1st degree relative <50   Stroke Other        F 1st degree relative <60   Hypertension Other    Diabetes Other        1st degree relative    SOCIAL HISTORY: Social History   Socioeconomic History   Marital status: Married    Spouse name: Not on file   Number of children: Not on file   Years of education: Not on file   Highest education level: Not on file  Occupational History   Not on file  Tobacco Use   Smoking status: Never   Smokeless tobacco: Not on file  Vaping Use   Vaping status: Never Used  Substance and Sexual Activity   Alcohol use: No   Drug use: Not Currently   Sexual activity: Not Currently  Other Topics Concern   Not on file  Social History Narrative   Not on file   Social Drivers of Health   Financial Resource Strain: Not on file  Food Insecurity: Not on file  Transportation Needs: Not on file  Physical Activity: Not on file  Stress: Not on file  Social Connections: Not on file  Intimate Partner  Violence: Not on file    PHYSICAL EXAM  GENERAL EXAM/CONSTITUTIONAL: Vitals:  Vitals:   10/03/24 1336  BP: 115/74  Pulse: 75  SpO2: 98%  Weight: 130 lb 3.2 oz (59.1 kg)  Height: 5' 6 (1.676 m)    Body mass index is 21.01 kg/m. Wt Readings from Last 3 Encounters:  10/03/24 130 lb 3.2 oz (59.1 kg)  11/16/22 126 lb (57.2 kg)  05/13/09 158 lb 8 oz (71.9 kg)   Patient is in no distress; well developed, nourished and groomed; neck is supple  MUSCULOSKELETAL: Gait, strength, tone, movements noted in Neurologic exam below  NEUROLOGIC: MENTAL STATUS:      No data to display  10/03/2024    1:43 PM 11/08/2023    1:43 PM 11/16/2022    1:25 PM  Montreal Cognitive Assessment   Visuospatial/ Executive (0/5) 3 5 5   Naming (0/3) 2 2 3   Attention: Read list of digits (0/2) 2 2 2   Attention: Read list of letters (0/1) 1 1 1   Attention: Serial 7 subtraction starting at 100 (0/3) 1 3 3   Language: Repeat phrase (0/2) 2 2 2   Language : Fluency (0/1) 1 1 1   Abstraction (0/2) 2 2 1   Delayed Recall (0/5) 0 0 0  Orientation (0/6) 3 4 2   Total 17 22 20   Adjusted Score (based on education)   20    CRANIAL NERVE:  2nd, 3rd, 4th, 6th- visual fields full to confrontation, extraocular muscles intact, no nystagmus 5th - facial sensation symmetric 7th - facial strength symmetric 8th - hearing intact 9th - palate elevates symmetrically, uvula midline 11th - shoulder shrug symmetric 12th - tongue protrusion midline  MOTOR:  normal bulk and tone, full strength in the BUE, BLE  SENSORY:  normal and symmetric to light touch  COORDINATION:  finger-nose-finger, fine finger movements normal  GAIT/STATION:  normal   DIAGNOSTIC DATA (LABS, IMAGING, TESTING) - I reviewed patient records, labs, notes, testing and imaging myself where available.  Lab Results  Component Value Date   HGB 13.6 03/28/2009   HCT 40.0 03/28/2009      Component Value Date/Time   NA 140  03/28/2009 1526   K 4.4 03/28/2009 1526   CL 106 03/28/2009 1526   GLUCOSE 94 03/28/2009 1526   BUN 22 03/28/2009 1526   CREATININE 1.2 03/28/2009 1526   No results found for: CHOL, HDL, LDLCALC, LDLDIRECT, TRIG, CHOLHDL No results found for: YHAJ8R No results found for: VITAMINB12 Lab Results  Component Value Date   TSH 1.010 11/16/2022    Vitamin B12  > 1550   04/23/2021  ATN PROFILE: Positive to Alzheimer disease biomarkers.   Neuropsychiatry Impression/Diagnosis:                      The results of the current neuropsychological evaluation are consistent with subjective reports and even represent greater memory deficits than her being reported are perceived.  The patient's objective neuropsychological assessment clearly indicate a preserved capacity to encode new information and attend to it and process the information in a timely manner.  However, when asked to process and store/organize that information there are severe and significant deficits for both auditory and visual memory domains.  Even with the limited information that is initially stored and organized it is quickly lost and the patient is showing profound deficits retrieving information after period of delay.  The patient is showing significant deficits in fluid reasoning and problem-solving ability and mild global losses of cognition with generally well-preserved information processing speed, expressive language capacity, visual-spatial and visual constructional capacity.  The patient does show some abnormalities on her ATN blood work consistent with an Alzheimer's type pathology if there were cognitive deficits present.  In this case there are significant memory and learning deficits with generally well-preserved expressive language capacity and significant deficits in actively processing information and fluid reasoning capacity.   As far as diagnostic considerations, the patient does present with symptoms  consistent with an Alzheimer's type pathology that is slowly progressed over the past 5 or 6 years after becoming clearly noticeable by 2020 if not earlier.  This slow progression would be consistent with a late onset  pattern.  Patient's family have reported some improvements in functioning with the addition of Aricept .  The patient also is showing some changes in mood and behavioral patterns more consistent with depressed mood rather than change in fundamental personality styles.   As far as treatment recommendations, I encouraged the patient and family to continue to work with her neurologist as he is providing good care and on the right track as far as treatment recommendations and strategies.  The patient may benefit from the addition of an SSRI medication to help with mood although some of these mood changes may likely be also part of her late onset Alzheimer's type pathology.  I will leave it up to the patient's neurologist about whether the patient would be an appropriate candidate for Lecanemab or other neurological interventions.  The patient's family appear to be approaching this in the very efficient and appropriate manner providing help to the patient as far as her IADLs and continuing to engage the patient in physical activity having the patient return to her dance activities which she had done throughout her life.  The patient is also being monitored as far as her diet and is maintaining a very good diet and has good support from family.   I will sit down with the patient and family and go over the results of the current neuropsychological evaluation and talk about specific recommendations for them beyond continuing to work with her treating neurologist.     Diagnosis:                                Moderate major neurocognitive disorder due to Alzheimer disease without behavioral disturbance (HCC)   Moderate late onset Alzheimer's dementia with mood disturbance (HCC)    ASSESSMENT AND  PLAN  78 y.o. year old female with history of hyperlipidemia who is presenting for follow-up for her moderate Alzheimer dementia.  She is currently on Aricept  10 mg nightly but family has reported worsening in her memory.  She is more forgetful, requiring constant reminders and repetition.  She needs help with her ADLs, but she is able to bathe and dress herself.  Will continue her on Aricept  10 mg nightly and add Namenda 10 mg twice daily.  We discussed the natural progression of the disease, the role of these medications, slowing down the disease process but not stopping it or reversing it.  We also discussed the new monoclonal antibody such as Leqembi and Kisunla but family is opting to continue with Aricept  and Namenda. Advised them to contact me if she experiences any side effects otherwise I will see her in 1 year for follow-up or sooner if worse.   1. Moderate late onset Alzheimer's dementia without behavioral disturbance, psychotic disturbance, mood disturbance, or anxiety (HCC)     Patient Instructions  Continue with Aricept  10 mg nightly Start Namenda 5 mg daily, increase weekly with a goal of 10 mg twice daily Please call for any side effect from the medication Follow-up in 1 year or sooner if worse   There are well-accepted and sensible ways to reduce risk for Alzheimers disease and other degenerative brain disorders .  Exercise Daily Walk A daily 20 minute walk should be part of your routine. Disease related apathy can be a significant roadblock to exercise and the only way to overcome this is to make it a daily routine and perhaps have a reward at the end (something your loved  one loves to eat or drink perhaps) or a personal trainer coming to the home can also be very useful. Most importantly, the patient is much more likely to exercise if the caregiver / spouse does it with him/her. In general a structured, repetitive schedule is best.  General Health: Any diseases which effect  your body will effect your brain such as a pneumonia, urinary infection, blood clot, heart attack or stroke. Keep contact with your primary care doctor for regular follow ups.  Sleep. A good nights sleep is healthy for the brain. Seven hours is recommended. If you have insomnia or poor sleep habits we can give you some instructions. If you have sleep apnea wear your mask.  Diet: Eating a heart healthy diet is also a good idea; fish and poultry instead of red meat, nuts (mostly non-peanuts), vegetables, fruits, olive oil or canola oil (instead of butter), minimal salt (use other spices to flavor foods), whole grain rice, bread, cereal and pasta and wine in moderation.Research is now showing that the MIND diet, which is a combination of The Mediterranean diet and the DASH diet, is beneficial for cognitive processing and longevity. Information about this diet can be found in The MIND Diet, a book by Annitta Feeling, MS, RDN, and online at wildwildscience.es  Finances, Power of [address] and Advance Directives: You should consider putting legal safeguards in place with regard to financial and medical decision making. While the spouse always has power of attorney for medical and financial issues in the absence of any form, you should consider what you want in case the spouse / caregiver is no longer around or capable of making decisions.   No orders of the defined types were placed in this encounter.   Meds ordered this encounter  Medications   memantine (NAMENDA TITRATION PACK) tablet pack    Sig: 5 mg/day for =1 week; 5 mg twice daily for =1 week; 15 mg/day given in 5 mg and 10 mg separated doses for =1 week; then 10 mg twice daily    Dispense:  49 tablet    Refill:  0   memantine (NAMENDA) 10 MG tablet    Sig: Take 1 tablet (10 mg total) by mouth 2 (two) times daily.    Dispense:  180 tablet    Refill:  3    Please dispense after November 24    Return in about 1 year  (around 10/03/2025).  I personally spent a total of 50 minutes in the care of the patient today including preparing to see the patient, getting/reviewing separately obtained history, performing a medically appropriate exam/evaluation, counseling and educating, placing orders, documenting clinical information in the EHR, and discussing the natural progress of these neurodegenerative disease and treatment options.  Pastor Falling, MD 10/03/2024, 2:54 PM  [facility] [address], [address] [place], [place] [address] [phone]

## 2024-10-03 NOTE — Patient Instructions (Addendum)
 Continue with Aricept  10 mg nightly Start Namenda 5 mg daily, increase weekly with a goal of 10 mg twice daily Please call for any side effect from the medication Follow-up in 1 year or sooner if worse   There are well-accepted and sensible ways to reduce risk for Alzheimers disease and other degenerative brain disorders .  Exercise Daily Walk A daily 20 minute walk should be part of your routine. Disease related apathy can be a significant roadblock to exercise and the only way to overcome this is to make it a daily routine and perhaps have a reward at the end (something your loved one loves to eat or drink perhaps) or a personal trainer coming to the home can also be very useful. Most importantly, the patient is much more likely to exercise if the caregiver / spouse does it with him/her. In general a structured, repetitive schedule is best.  General Health: Any diseases which effect your body will effect your brain such as a pneumonia, urinary infection, blood clot, heart attack or stroke. Keep contact with your primary care doctor for regular follow ups.  Sleep. A good nights sleep is healthy for the brain. Seven hours is recommended. If you have insomnia or poor sleep habits we can give you some instructions. If you have sleep apnea wear your mask.  Diet: Eating a heart healthy diet is also a good idea; fish and poultry instead of red meat, nuts (mostly non-peanuts), vegetables, fruits, olive oil or canola oil (instead of butter), minimal salt (use other spices to flavor foods), whole grain rice, bread, cereal and pasta and wine in moderation.Research is now showing that the MIND diet, which is a combination of The Mediterranean diet and the DASH diet, is beneficial for cognitive processing and longevity. Information about this diet can be found in The MIND Diet, a book by Annitta Feeling, MS, RDN, and online at wildwildscience.es  Finances, Power of [address] and Advance  Directives: You should consider putting legal safeguards in place with regard to financial and medical decision making. While the spouse always has power of attorney for medical and financial issues in the absence of any form, you should consider what you want in case the spouse / caregiver is no longer around or capable of making decisions.

## 2024-10-04 ENCOUNTER — Other Ambulatory Visit: Payer: Self-pay | Admitting: Neurology

## 2024-10-07 ENCOUNTER — Telehealth: Payer: Self-pay

## 2024-10-07 MED ORDER — MEMANTINE HCL 10 MG PO TABS: 10.0000 mg | ORAL_TABLET | Freq: Two times a day (BID) | ORAL | 3 refills | Status: AC

## 2024-10-07 NOTE — Telephone Encounter (Signed)
 Received notification from pharmacy that titration pack not available for namenda. No answer left message to call back . Per dr gregg follow this scheduled for 10 mg tablet namenda. Week 1 5mg  daily  Week 2 5 mg twice daily  Week 3   5 mg in am and 10 mg in pm  Week 4 10 mg in the am and 10 mg in the pm and continue that dosing.    New script sent to pharmacy

## 2025-10-09 ENCOUNTER — Ambulatory Visit: Admitting: Neurology
